# Patient Record
Sex: Female | Born: 1950 | Race: White | Hispanic: No | Smoking: Never smoker
Health system: Southern US, Community
[De-identification: ages and names within clinical notes are randomized; demographics above are authoritative.]

## PROBLEM LIST (undated history)

## (undated) DIAGNOSIS — I2510 Atherosclerotic heart disease of native coronary artery without angina pectoris: Secondary | ICD-10-CM

## (undated) DIAGNOSIS — E785 Hyperlipidemia, unspecified: Secondary | ICD-10-CM

## (undated) DIAGNOSIS — F99 Mental disorder, not otherwise specified: Secondary | ICD-10-CM

## (undated) DIAGNOSIS — M1990 Unspecified osteoarthritis, unspecified site: Secondary | ICD-10-CM

## (undated) DIAGNOSIS — I10 Essential (primary) hypertension: Secondary | ICD-10-CM

## (undated) DIAGNOSIS — E782 Mixed hyperlipidemia: Secondary | ICD-10-CM

## (undated) DIAGNOSIS — I209 Angina pectoris, unspecified: Secondary | ICD-10-CM

## (undated) DIAGNOSIS — D649 Anemia, unspecified: Secondary | ICD-10-CM

## (undated) DIAGNOSIS — I8390 Asymptomatic varicose veins of unspecified lower extremity: Secondary | ICD-10-CM

## (undated) DIAGNOSIS — K219 Gastro-esophageal reflux disease without esophagitis: Secondary | ICD-10-CM

## (undated) DIAGNOSIS — J45909 Unspecified asthma, uncomplicated: Secondary | ICD-10-CM

## (undated) DIAGNOSIS — I499 Cardiac arrhythmia, unspecified: Secondary | ICD-10-CM

## (undated) HISTORY — PX: CORONARY ANGIOPLASTY: SHX604

## (undated) HISTORY — DX: Mixed hyperlipidemia: E78.2

## (undated) HISTORY — PX: CARDIAC CATHETERIZATION: SHX172

## (undated) HISTORY — DX: Atherosclerotic heart disease of native coronary artery without angina pectoris: I25.10

## (undated) HISTORY — DX: Hyperlipidemia, unspecified: E78.5

## (undated) HISTORY — PX: VEIN SURGERY: SHX48

---

## 1995-04-17 HISTORY — PX: TOTAL ABDOMINAL HYSTERECTOMY W/ BILATERAL SALPINGOOPHORECTOMY: SHX83

## 1997-02-14 HISTORY — PX: BREAST BIOPSY: SHX20

## 1998-10-06 ENCOUNTER — Other Ambulatory Visit: Admission: RE | Admit: 1998-10-06 | Discharge: 1998-10-06 | Payer: Self-pay | Admitting: Obstetrics and Gynecology

## 2001-06-26 ENCOUNTER — Encounter: Payer: Self-pay | Admitting: Obstetrics and Gynecology

## 2001-06-26 ENCOUNTER — Encounter: Admission: RE | Admit: 2001-06-26 | Discharge: 2001-06-26 | Payer: Self-pay | Admitting: Obstetrics and Gynecology

## 2006-11-08 ENCOUNTER — Other Ambulatory Visit: Admission: RE | Admit: 2006-11-08 | Discharge: 2006-11-08 | Payer: Self-pay | Admitting: Obstetrics & Gynecology

## 2012-05-17 HISTORY — PX: CATARACT EXTRACTION: SUR2

## 2012-11-13 ENCOUNTER — Encounter (HOSPITAL_COMMUNITY): Payer: Self-pay | Admitting: Pharmacy Technician

## 2012-11-15 ENCOUNTER — Other Ambulatory Visit: Payer: Self-pay | Admitting: Orthopedic Surgery

## 2012-11-20 ENCOUNTER — Encounter (HOSPITAL_COMMUNITY)
Admission: RE | Admit: 2012-11-20 | Discharge: 2012-11-20 | Disposition: A | Discharge status: Home / Self Care | Payer: BC Managed Care – PPO | Source: Ambulatory Visit | Attending: Orthopedic Surgery | Admitting: Orthopedic Surgery

## 2012-11-20 ENCOUNTER — Encounter (HOSPITAL_COMMUNITY): Payer: Self-pay

## 2012-11-20 DIAGNOSIS — Z01818 Encounter for other preprocedural examination: Secondary | ICD-10-CM | POA: Insufficient documentation

## 2012-11-20 DIAGNOSIS — Z01812 Encounter for preprocedural laboratory examination: Secondary | ICD-10-CM | POA: Insufficient documentation

## 2012-11-20 DIAGNOSIS — Z01810 Encounter for preprocedural cardiovascular examination: Secondary | ICD-10-CM | POA: Insufficient documentation

## 2012-11-20 HISTORY — DX: Angina pectoris, unspecified: I20.9

## 2012-11-20 HISTORY — DX: Gastro-esophageal reflux disease without esophagitis: K21.9

## 2012-11-20 HISTORY — DX: Anemia, unspecified: D64.9

## 2012-11-20 HISTORY — DX: Essential (primary) hypertension: I10

## 2012-11-20 HISTORY — DX: Unspecified asthma, uncomplicated: J45.909

## 2012-11-20 HISTORY — DX: Asymptomatic varicose veins of unspecified lower extremity: I83.90

## 2012-11-20 HISTORY — DX: Unspecified osteoarthritis, unspecified site: M19.90

## 2012-11-20 HISTORY — DX: Cardiac arrhythmia, unspecified: I49.9

## 2012-11-20 HISTORY — DX: Mental disorder, not otherwise specified: F99

## 2012-11-20 LAB — URINALYSIS, ROUTINE W REFLEX MICROSCOPIC
Bilirubin Urine: NEGATIVE
Glucose, UA: NEGATIVE mg/dL
Hgb urine dipstick: NEGATIVE
Ketones, ur: NEGATIVE mg/dL
Leukocytes, UA: NEGATIVE
Nitrite: NEGATIVE
Protein, ur: NEGATIVE mg/dL
Specific Gravity, Urine: 1.022 (ref 1.005–1.030)
Urobilinogen, UA: 0.2 mg/dL (ref 0.0–1.0)
pH: 6 (ref 5.0–8.0)

## 2012-11-20 LAB — COMPREHENSIVE METABOLIC PANEL
ALT: 23 U/L (ref 0–35)
AST: 26 U/L (ref 0–37)
Albumin: 3.8 g/dL (ref 3.5–5.2)
Alkaline Phosphatase: 64 U/L (ref 39–117)
BUN: 23 mg/dL (ref 6–23)
CO2: 24 mEq/L (ref 19–32)
Calcium: 9.3 mg/dL (ref 8.4–10.5)
Chloride: 103 mEq/L (ref 96–112)
Creatinine, Ser: 0.98 mg/dL (ref 0.50–1.10)
GFR calc Af Amer: 69 mL/min — ABNORMAL LOW (ref >90)
GFR calc non Af Amer: 60 mL/min — ABNORMAL LOW (ref >90)
Glucose, Bld: 98 mg/dL (ref 70–99)
Potassium: 3.8 mEq/L (ref 3.5–5.1)
Sodium: 139 mEq/L (ref 135–145)
Total Bilirubin: 0.2 mg/dL — ABNORMAL LOW (ref 0.3–1.2)
Total Protein: 7.2 g/dL (ref 6.0–8.3)

## 2012-11-20 LAB — APTT: aPTT: 23 seconds — ABNORMAL LOW (ref 24–37)

## 2012-11-20 LAB — CBC WITH DIFFERENTIAL/PLATELET
Basophils Absolute: 0 10*3/uL (ref 0.0–0.1)
Basophils Relative: 1 % (ref 0–1)
Eosinophils Absolute: 0.1 10*3/uL (ref 0.0–0.7)
Eosinophils Relative: 1 % (ref 0–5)
HCT: 34.5 % — ABNORMAL LOW (ref 36.0–46.0)
Hemoglobin: 12.1 g/dL (ref 12.0–15.0)
Lymphocytes Relative: 35 % (ref 12–46)
Lymphs Abs: 1.9 10*3/uL (ref 0.7–4.0)
MCH: 31.6 pg (ref 26.0–34.0)
MCHC: 35.1 g/dL (ref 30.0–36.0)
MCV: 90.1 fL (ref 78.0–100.0)
Monocytes Absolute: 0.3 10*3/uL (ref 0.1–1.0)
Monocytes Relative: 6 % (ref 3–12)
Neutro Abs: 3.1 10*3/uL (ref 1.7–7.7)
Neutrophils Relative %: 57 % (ref 43–77)
Platelets: 240 10*3/uL (ref 150–400)
RBC: 3.83 MIL/uL — ABNORMAL LOW (ref 3.87–5.11)
RDW: 12.6 % (ref 11.5–15.5)
WBC: 5.4 10*3/uL (ref 4.0–10.5)

## 2012-11-20 LAB — TYPE AND SCREEN
ABO/RH(D): A POS
Antibody Screen: NEGATIVE

## 2012-11-20 LAB — SURGICAL PCR SCREEN
MRSA, PCR: NEGATIVE
Staphylococcus aureus: NEGATIVE

## 2012-11-20 LAB — ABO/RH: ABO/RH(D): A POS

## 2012-11-20 LAB — PROTIME-INR
INR: 0.89 (ref 0.00–1.49)
Prothrombin Time: 11.9 seconds (ref 11.6–15.2)

## 2012-11-20 NOTE — Pre-Procedure Instructions (Addendum)
Emily Ayers  11/20/2012   Your procedure is scheduled on: 7.14.14  Report to [facility] at 530 AM.  Call this number if you have problems the morning of surgery: [phone]   Remember:   Do not eat food or drink liquids after midnight.   Take these medicines the morning of surgery with A SIP OF WATER:prilosec           STOP   Aspirin,fish oil 7.9.14   Do not wear jewelry, make-up or nail polish.  Do not wear lotions, powders, or perfumes. You may wear deodorant.  Do not shave 48 hours prior to surgery. Men may shave face and neck.  Do not bring valuables to the hospital.  [facility] is not responsible                   for any belongings or valuables.  Contacts, dentures or bridgework may not be worn into surgery.  Leave suitcase in the car. After surgery it may be brought to your room.  For patients admitted to the hospital, checkout time is 11:00 AM the day of  discharge.   Patients discharged the day of surgery will not be allowed to drive  home.  Name and phone number of your driver:   Special Instructions: Incentive Spirometry - Practice and bring it with you on the day of surgery. Shower using CHG 2 nights before surgery and the night before surgery.  If you shower the day of surgery use CHG.  Use special wash - you have one bottle of CHG for all showers.  You should use approximately 1/3 of the bottle for each shower.   Please read over the following fact sheets that you were given: Pain Booklet, Coughing and Deep Breathing, Blood Transfusion Information, Total Joint Packet, MRSA Information and Surgical Site Infection Prevention

## 2012-11-20 NOTE — Progress Notes (Signed)
req'd notes, any tests from dr Nikki Dom cardiology Rosalita Levan

## 2012-11-22 LAB — URINE CULTURE: Colony Count: >=100000

## 2012-11-26 MED ORDER — SODIUM CHLORIDE 0.9 % IV SOLN: INTRAVENOUS | Status: DC

## 2012-11-26 MED ORDER — TRANEXAMIC ACID 100 MG/ML IV SOLN
1000.0000 mg | INTRAVENOUS | Status: AC
  Administered 2012-11-27: 1000 mg via INTRAVENOUS
  Filled 2012-11-26: qty 10

## 2012-11-26 MED ORDER — CEFAZOLIN SODIUM-DEXTROSE 2-3 GM-% IV SOLR
2.0000 g | INTRAVENOUS | Status: AC
  Administered 2012-11-27: 2 g via INTRAVENOUS
  Filled 2012-11-26: qty 50

## 2012-11-27 ENCOUNTER — Encounter (HOSPITAL_COMMUNITY): Payer: Self-pay | Admitting: Anesthesiology

## 2012-11-27 ENCOUNTER — Inpatient Hospital Stay (HOSPITAL_COMMUNITY): Payer: BC Managed Care – PPO | Admitting: Anesthesiology

## 2012-11-27 ENCOUNTER — Encounter (HOSPITAL_COMMUNITY): Payer: Self-pay | Admitting: *Deleted

## 2012-11-27 ENCOUNTER — Inpatient Hospital Stay (HOSPITAL_COMMUNITY)
Admission: RE | Admit: 2012-11-27 | Discharge: 2012-11-29 | DRG: 209 | Disposition: A | Discharge status: Home / Self Care | Payer: BC Managed Care – PPO | Source: Ambulatory Visit | Attending: Orthopedic Surgery | Admitting: Orthopedic Surgery

## 2012-11-27 ENCOUNTER — Encounter (HOSPITAL_COMMUNITY)
Admission: RE | Disposition: A | Discharge status: Home / Self Care | Payer: Self-pay | Source: Ambulatory Visit | Attending: Orthopedic Surgery

## 2012-11-27 DIAGNOSIS — K219 Gastro-esophageal reflux disease without esophagitis: Secondary | ICD-10-CM | POA: Diagnosis present

## 2012-11-27 DIAGNOSIS — IMO0002 Reserved for concepts with insufficient information to code with codable children: Secondary | ICD-10-CM

## 2012-11-27 DIAGNOSIS — J45909 Unspecified asthma, uncomplicated: Secondary | ICD-10-CM | POA: Diagnosis present

## 2012-11-27 DIAGNOSIS — Z7982 Long term (current) use of aspirin: Secondary | ICD-10-CM

## 2012-11-27 DIAGNOSIS — F319 Bipolar disorder, unspecified: Secondary | ICD-10-CM | POA: Diagnosis present

## 2012-11-27 DIAGNOSIS — M1710 Unilateral primary osteoarthritis, unspecified knee: Principal | ICD-10-CM | POA: Diagnosis present

## 2012-11-27 DIAGNOSIS — Z96652 Presence of left artificial knee joint: Secondary | ICD-10-CM

## 2012-11-27 HISTORY — PX: TOTAL KNEE ARTHROPLASTY: SHX125

## 2012-11-27 LAB — CBC
HCT: 29.3 % — ABNORMAL LOW (ref 36.0–46.0)
Hemoglobin: 10.2 g/dL — ABNORMAL LOW (ref 12.0–15.0)
MCH: 31.8 pg (ref 26.0–34.0)
MCHC: 34.8 g/dL (ref 30.0–36.0)
MCV: 91.3 fL (ref 78.0–100.0)
Platelets: 197 10*3/uL (ref 150–400)
RBC: 3.21 MIL/uL — ABNORMAL LOW (ref 3.87–5.11)
RDW: 12.6 % (ref 11.5–15.5)
WBC: 6.6 10*3/uL (ref 4.0–10.5)

## 2012-11-27 LAB — CREATININE, SERUM
Creatinine, Ser: 1.01 mg/dL (ref 0.50–1.10)
GFR calc Af Amer: 67 mL/min — ABNORMAL LOW (ref >90)
GFR calc non Af Amer: 58 mL/min — ABNORMAL LOW (ref >90)

## 2012-11-27 SURGERY — ARTHROPLASTY, KNEE, TOTAL
Anesthesia: General | Site: Knee | Laterality: Left | Wound class: Clean

## 2012-11-27 MED ORDER — DOCUSATE SODIUM 100 MG PO CAPS
100.0000 mg | ORAL_CAPSULE | Freq: Two times a day (BID) | ORAL | Status: DC
  Administered 2012-11-27 – 2012-11-29 (×5): 100 mg via ORAL
  Filled 2012-11-27 (×4): qty 1

## 2012-11-27 MED ORDER — HYDROMORPHONE HCL PF 1 MG/ML IJ SOLN
INTRAMUSCULAR | Status: AC
  Filled 2012-11-27: qty 1

## 2012-11-27 MED ORDER — SODIUM CHLORIDE 0.9 % IV SOLN
INTRAVENOUS | Status: DC
  Administered 2012-11-27 – 2012-11-28 (×2): via INTRAVENOUS

## 2012-11-27 MED ORDER — LACTATED RINGERS IV SOLN
INTRAVENOUS | Status: DC | PRN
  Administered 2012-11-27 (×3): via INTRAVENOUS

## 2012-11-27 MED ORDER — OXYCODONE HCL 5 MG PO TABS
5.0000 mg | ORAL_TABLET | ORAL | Status: DC | PRN
  Administered 2012-11-27 – 2012-11-29 (×4): 10 mg via ORAL
  Filled 2012-11-27 (×4): qty 2

## 2012-11-27 MED ORDER — ALBUTEROL SULFATE HFA 108 (90 BASE) MCG/ACT IN AERS
2.0000 | INHALATION_SPRAY | Freq: Four times a day (QID) | RESPIRATORY_TRACT | Status: DC | PRN
  Filled 2012-11-27: qty 6.7

## 2012-11-27 MED ORDER — HYDROMORPHONE HCL PF 1 MG/ML IJ SOLN
0.2500 mg | INTRAMUSCULAR | Status: DC | PRN
  Administered 2012-11-27 (×2): 0.5 mg via INTRAVENOUS

## 2012-11-27 MED ORDER — MENTHOL 3 MG MT LOZG: 1.0000 | LOZENGE | OROMUCOSAL | Status: DC | PRN

## 2012-11-27 MED ORDER — SODIUM CHLORIDE 0.9 % IJ SOLN
INTRAMUSCULAR | Status: DC | PRN
  Administered 2012-11-27: 08:00:00

## 2012-11-27 MED ORDER — CITALOPRAM HYDROBROMIDE 10 MG PO TABS
10.0000 mg | ORAL_TABLET | Freq: Every day | ORAL | Status: DC
  Administered 2012-11-27 – 2012-11-29 (×3): 10 mg via ORAL
  Filled 2012-11-27 (×3): qty 1

## 2012-11-27 MED ORDER — ACETAMINOPHEN 650 MG RE SUPP: 650.0000 mg | Freq: Four times a day (QID) | RECTAL | Status: DC | PRN

## 2012-11-27 MED ORDER — VALSARTAN-HYDROCHLOROTHIAZIDE 320-25 MG PO TABS: 1.0000 | ORAL_TABLET | Freq: Every day | ORAL | Status: DC

## 2012-11-27 MED ORDER — OXYCODONE HCL ER 10 MG PO T12A
10.0000 mg | EXTENDED_RELEASE_TABLET | Freq: Two times a day (BID) | ORAL | Status: DC
  Administered 2012-11-27 – 2012-11-29 (×5): 10 mg via ORAL
  Filled 2012-11-27 (×5): qty 1

## 2012-11-27 MED ORDER — PROMETHAZINE HCL 25 MG/ML IJ SOLN: 6.2500 mg | INTRAMUSCULAR | Status: DC | PRN

## 2012-11-27 MED ORDER — ONDANSETRON HCL 4 MG PO TABS: 4.0000 mg | ORAL_TABLET | Freq: Four times a day (QID) | ORAL | Status: DC | PRN

## 2012-11-27 MED ORDER — HYDROCHLOROTHIAZIDE 25 MG PO TABS
25.0000 mg | ORAL_TABLET | Freq: Every day | ORAL | Status: DC
  Administered 2012-11-27 – 2012-11-29 (×3): 25 mg via ORAL
  Filled 2012-11-27 (×3): qty 1

## 2012-11-27 MED ORDER — METHOCARBAMOL 500 MG PO TABS
500.0000 mg | ORAL_TABLET | Freq: Four times a day (QID) | ORAL | Status: DC | PRN
  Administered 2012-11-27 – 2012-11-28 (×2): 500 mg via ORAL
  Filled 2012-11-27 (×2): qty 1

## 2012-11-27 MED ORDER — PHENOL 1.4 % MT LIQD: 1.0000 | OROMUCOSAL | Status: DC | PRN

## 2012-11-27 MED ORDER — FLEET ENEMA 7-19 GM/118ML RE ENEM: 1.0000 | ENEMA | Freq: Once | RECTAL | Status: AC | PRN

## 2012-11-27 MED ORDER — FENTANYL CITRATE 0.05 MG/ML IJ SOLN
INTRAMUSCULAR | Status: DC | PRN
  Administered 2012-11-27 (×2): 25 ug via INTRAVENOUS
  Administered 2012-11-27: 100 ug via INTRAVENOUS
  Administered 2012-11-27 (×2): 25 ug via INTRAVENOUS

## 2012-11-27 MED ORDER — CHLORHEXIDINE GLUCONATE 4 % EX LIQD: 60.0000 mL | Freq: Once | CUTANEOUS | Status: DC

## 2012-11-27 MED ORDER — MONTELUKAST SODIUM 10 MG PO TABS
10.0000 mg | ORAL_TABLET | Freq: Every day | ORAL | Status: DC
  Administered 2012-11-27 – 2012-11-28 (×2): 10 mg via ORAL
  Filled 2012-11-27 (×3): qty 1

## 2012-11-27 MED ORDER — PROPOFOL 10 MG/ML IV BOLUS
INTRAVENOUS | Status: DC | PRN
  Administered 2012-11-27: 200 mg via INTRAVENOUS

## 2012-11-27 MED ORDER — METHOCARBAMOL 100 MG/ML IJ SOLN: 500.0000 mg | Freq: Four times a day (QID) | INTRAVENOUS | Status: DC | PRN

## 2012-11-27 MED ORDER — METOCLOPRAMIDE HCL 5 MG PO TABS
5.0000 mg | ORAL_TABLET | Freq: Three times a day (TID) | ORAL | Status: DC | PRN
  Filled 2012-11-27: qty 2

## 2012-11-27 MED ORDER — ALUM & MAG HYDROXIDE-SIMETH 200-200-20 MG/5ML PO SUSP
30.0000 mL | ORAL | Status: DC | PRN
  Administered 2012-11-27 – 2012-11-28 (×2): 30 mL via ORAL
  Filled 2012-11-27 (×2): qty 30

## 2012-11-27 MED ORDER — ENOXAPARIN SODIUM 30 MG/0.3ML ~~LOC~~ SOLN
30.0000 mg | Freq: Two times a day (BID) | SUBCUTANEOUS | Status: DC
  Administered 2012-11-28 – 2012-11-29 (×3): 30 mg via SUBCUTANEOUS
  Filled 2012-11-27 (×5): qty 0.3

## 2012-11-27 MED ORDER — DIPHENHYDRAMINE HCL 12.5 MG/5ML PO ELIX: 12.5000 mg | ORAL_SOLUTION | ORAL | Status: DC | PRN

## 2012-11-27 MED ORDER — SODIUM CHLORIDE 0.9 % IJ SOLN
INTRAMUSCULAR | Status: AC
  Filled 2012-11-27: qty 3

## 2012-11-27 MED ORDER — BUPIVACAINE LIPOSOME 1.3 % IJ SUSP
20.0000 mL | Freq: Once | INTRAMUSCULAR | Status: DC
  Filled 2012-11-27: qty 20

## 2012-11-27 MED ORDER — BUPIVACAINE HCL (PF) 0.5 % IJ SOLN
INTRAMUSCULAR | Status: AC
  Filled 2012-11-27: qty 30

## 2012-11-27 MED ORDER — GLYCOPYRROLATE 0.2 MG/ML IJ SOLN
INTRAMUSCULAR | Status: DC | PRN
  Administered 2012-11-27: 0.2 mg via INTRAVENOUS

## 2012-11-27 MED ORDER — ZOLPIDEM TARTRATE 5 MG PO TABS: 5.0000 mg | ORAL_TABLET | Freq: Every evening | ORAL | Status: DC | PRN

## 2012-11-27 MED ORDER — ONDANSETRON HCL 4 MG/2ML IJ SOLN: 4.0000 mg | Freq: Four times a day (QID) | INTRAMUSCULAR | Status: DC | PRN

## 2012-11-27 MED ORDER — PANTOPRAZOLE SODIUM 40 MG PO TBEC
40.0000 mg | DELAYED_RELEASE_TABLET | Freq: Every day | ORAL | Status: DC
  Administered 2012-11-28: 40 mg via ORAL
  Filled 2012-11-27: qty 1

## 2012-11-27 MED ORDER — ACETAMINOPHEN 325 MG PO TABS: 650.0000 mg | ORAL_TABLET | Freq: Four times a day (QID) | ORAL | Status: DC | PRN

## 2012-11-27 MED ORDER — BUPIVACAINE-EPINEPHRINE PF 0.5-1:200000 % IJ SOLN
INTRAMUSCULAR | Status: DC | PRN
  Administered 2012-11-27: 150 mg

## 2012-11-27 MED ORDER — ONDANSETRON HCL 4 MG/2ML IJ SOLN
INTRAMUSCULAR | Status: DC | PRN
  Administered 2012-11-27: 4 mg via INTRAVENOUS

## 2012-11-27 MED ORDER — IRBESARTAN 300 MG PO TABS
300.0000 mg | ORAL_TABLET | Freq: Every day | ORAL | Status: DC
  Administered 2012-11-27 – 2012-11-29 (×3): 300 mg via ORAL
  Filled 2012-11-27 (×3): qty 1

## 2012-11-27 MED ORDER — BISACODYL 5 MG PO TBEC: 5.0000 mg | DELAYED_RELEASE_TABLET | Freq: Every day | ORAL | Status: DC | PRN

## 2012-11-27 MED ORDER — BUPIVACAINE HCL 0.5 % IJ SOLN
INTRAMUSCULAR | Status: DC | PRN
  Administered 2012-11-27: 30 mL

## 2012-11-27 MED ORDER — METOCLOPRAMIDE HCL 5 MG/ML IJ SOLN: 5.0000 mg | Freq: Three times a day (TID) | INTRAMUSCULAR | Status: DC | PRN

## 2012-11-27 MED ORDER — CELECOXIB 200 MG PO CAPS
200.0000 mg | ORAL_CAPSULE | Freq: Two times a day (BID) | ORAL | Status: DC
  Administered 2012-11-27 – 2012-11-28 (×3): 200 mg via ORAL
  Filled 2012-11-27 (×6): qty 1

## 2012-11-27 MED ORDER — PHENYLEPHRINE HCL 10 MG/ML IJ SOLN
INTRAMUSCULAR | Status: DC | PRN
  Administered 2012-11-27: 120 ug via INTRAVENOUS
  Administered 2012-11-27 (×2): 80 ug via INTRAVENOUS
  Administered 2012-11-27: 40 ug via INTRAVENOUS

## 2012-11-27 MED ORDER — HYDROMORPHONE HCL PF 1 MG/ML IJ SOLN: 1.0000 mg | INTRAMUSCULAR | Status: DC | PRN

## 2012-11-27 MED ORDER — SODIUM CHLORIDE 0.9 % IR SOLN
Status: DC | PRN
  Administered 2012-11-27: 1000 mL
  Administered 2012-11-27: 3000 mL

## 2012-11-27 MED ORDER — SENNOSIDES-DOCUSATE SODIUM 8.6-50 MG PO TABS
1.0000 | ORAL_TABLET | Freq: Every evening | ORAL | Status: DC | PRN
  Filled 2012-11-27: qty 1

## 2012-11-27 MED ORDER — CEFAZOLIN SODIUM 1-5 GM-% IV SOLN
1.0000 g | Freq: Four times a day (QID) | INTRAVENOUS | Status: AC
  Administered 2012-11-27 (×2): 1 g via INTRAVENOUS
  Filled 2012-11-27 (×2): qty 50

## 2012-11-27 MED ORDER — MIDAZOLAM HCL 5 MG/5ML IJ SOLN
INTRAMUSCULAR | Status: DC | PRN
  Administered 2012-11-27: 2 mg via INTRAVENOUS

## 2012-11-27 MED ORDER — PNEUMOCOCCAL VAC POLYVALENT 25 MCG/0.5ML IJ INJ
0.5000 mL | INJECTION | INTRAMUSCULAR | Status: DC
  Filled 2012-11-27: qty 0.5

## 2012-11-27 SURGICAL SUPPLY — 57 items
BANDAGE ESMARK 6X9 LF (GAUZE/BANDAGES/DRESSINGS) ×1 IMPLANT
BLADE SAGITTAL 13X1.27X60 (BLADE) ×2 IMPLANT
BLADE SAW SGTL 83.5X18.5 (BLADE) ×2 IMPLANT
BNDG CMPR 9X6 STRL LF SNTH (GAUZE/BANDAGES/DRESSINGS) ×1
BNDG ESMARK 6X9 LF (GAUZE/BANDAGES/DRESSINGS) ×2
BOWL SMART MIX CTS (DISPOSABLE) ×2 IMPLANT
CEMENT BONE SIMPLEX SPEEDSET (Cement) ×4 IMPLANT
CLOTH BEACON ORANGE TIMEOUT ST (SAFETY) ×2 IMPLANT
COVER SURGICAL LIGHT HANDLE (MISCELLANEOUS) ×2 IMPLANT
CUFF TOURNIQUET SINGLE 34IN LL (TOURNIQUET CUFF) ×2 IMPLANT
DRAPE EXTREMITY T 121X128X90 (DRAPE) ×2 IMPLANT
DRAPE INCISE IOBAN 66X45 STRL (DRAPES) ×4 IMPLANT
DRAPE PROXIMA HALF (DRAPES) ×2 IMPLANT
DRAPE U-SHAPE 47X51 STRL (DRAPES) ×2 IMPLANT
DRSG ADAPTIC 3X8 NADH LF (GAUZE/BANDAGES/DRESSINGS) ×2 IMPLANT
DRSG PAD ABDOMINAL 8X10 ST (GAUZE/BANDAGES/DRESSINGS) ×2 IMPLANT
DURAPREP 26ML APPLICATOR (WOUND CARE) ×3 IMPLANT
ELECT REM PT RETURN 9FT ADLT (ELECTROSURGICAL) ×2
ELECTRODE REM PT RTRN 9FT ADLT (ELECTROSURGICAL) ×1 IMPLANT
EVACUATOR 1/8 PVC DRAIN (DRAIN) ×2 IMPLANT
GLOVE BIOGEL M 7.0 STRL (GLOVE) IMPLANT
GLOVE BIOGEL PI IND STRL 7.5 (GLOVE) IMPLANT
GLOVE BIOGEL PI IND STRL 8.5 (GLOVE) ×1 IMPLANT
GLOVE BIOGEL PI INDICATOR 7.5 (GLOVE)
GLOVE BIOGEL PI INDICATOR 8.5 (GLOVE) ×1
GLOVE BIOGEL PI ORTHO PRO SZ8 (GLOVE) ×1
GLOVE PI ORTHO PRO STRL SZ8 (GLOVE) ×1 IMPLANT
GLOVE SURG ORTHO 8.0 STRL STRW (GLOVE) ×4 IMPLANT
GOWN PREVENTION PLUS XLARGE (GOWN DISPOSABLE) ×5 IMPLANT
GOWN STRL NON-REIN LRG LVL3 (GOWN DISPOSABLE) ×2 IMPLANT
HANDPIECE INTERPULSE COAX TIP (DISPOSABLE) ×2
HOOD PEEL AWAY FACE SHEILD DIS (HOOD) ×7 IMPLANT
KIT BASIN OR (CUSTOM PROCEDURE TRAY) ×2 IMPLANT
KIT ROOM TURNOVER OR (KITS) ×2 IMPLANT
MANIFOLD NEPTUNE II (INSTRUMENTS) ×2 IMPLANT
NDL HYPO 21X1.5 SAFETY (NEEDLE) ×1 IMPLANT
NEEDLE 22X1 1/2 (OR ONLY) (NEEDLE) ×1 IMPLANT
NEEDLE HYPO 21X1.5 SAFETY (NEEDLE) ×2 IMPLANT
NS IRRIG 1000ML POUR BTL (IV SOLUTION) ×2 IMPLANT
PACK TOTAL JOINT (CUSTOM PROCEDURE TRAY) ×2 IMPLANT
PAD ARMBOARD 7.5X6 YLW CONV (MISCELLANEOUS) ×4 IMPLANT
PADDING CAST COTTON 6X4 STRL (CAST SUPPLIES) ×2 IMPLANT
SET HNDPC FAN SPRY TIP SCT (DISPOSABLE) ×1 IMPLANT
SPONGE GAUZE 4X4 12PLY (GAUZE/BANDAGES/DRESSINGS) ×2 IMPLANT
STAPLER VISISTAT 35W (STAPLE) ×2 IMPLANT
SUCTION FRAZIER TIP 10 FR DISP (SUCTIONS) ×2 IMPLANT
SUT BONE WAX W31G (SUTURE) ×2 IMPLANT
SUT VIC AB 0 CTB1 27 (SUTURE) ×4 IMPLANT
SUT VIC AB 1 CT1 27 (SUTURE) ×4
SUT VIC AB 1 CT1 27XBRD ANBCTR (SUTURE) ×2 IMPLANT
SUT VIC AB 2-0 CT1 27 (SUTURE) ×4
SUT VIC AB 2-0 CT1 TAPERPNT 27 (SUTURE) ×2 IMPLANT
SYR CONTROL 10ML LL (SYRINGE) ×1 IMPLANT
TOWEL OR 17X24 6PK STRL BLUE (TOWEL DISPOSABLE) ×2 IMPLANT
TOWEL OR 17X26 10 PK STRL BLUE (TOWEL DISPOSABLE) ×2 IMPLANT
TRAY FOLEY CATH 16FRSI W/METER (SET/KITS/TRAYS/PACK) ×2 IMPLANT
WATER STERILE IRR 1000ML POUR (IV SOLUTION) ×3 IMPLANT

## 2012-11-27 NOTE — Anesthesia Preprocedure Evaluation (Addendum)
Anesthesia Evaluation  Patient identified by MRN, date of birth, ID band Patient awake    Reviewed: Allergy & Precautions, H&P , NPO status , Patient's Chart, lab work & pertinent test results, reviewed documented beta blocker date and time   Airway Mallampati: II TM Distance: >3 FB Neck ROM: Full    Dental  (+) Teeth Intact and Dental Advisory Given   Pulmonary asthma ,    Pulmonary exam normal       Cardiovascular hypertension, Pt. on medications     Neuro/Psych PSYCHIATRIC DISORDERS Bipolar Disorder negative neurological ROS     GI/Hepatic Neg liver ROS, GERD-  Medicated,  Endo/Other  negative endocrine ROS  Renal/GU negative Renal ROS     Musculoskeletal   Abdominal   Peds  Hematology negative hematology ROS (+)   Anesthesia Other Findings   Reproductive/Obstetrics                         Anesthesia Physical Anesthesia Plan  ASA: II  Anesthesia Plan: General   Post-op Pain Management:    Induction: Intravenous  Airway Management Planned: LMA  Additional Equipment:   Intra-op Plan:   Post-operative Plan: Extubation in OR  Informed Consent: I have reviewed the patients History and Physical, chart, labs and discussed the procedure including the risks, benefits and alternatives for the proposed anesthesia with the patient or authorized representative who has indicated his/her understanding and acceptance.   Dental advisory given  Plan Discussed with: CRNA, Anesthesiologist and Surgeon  Anesthesia Plan Comments:        Anesthesia Quick Evaluation

## 2012-11-27 NOTE — Transfer of Care (Signed)
Immediate Anesthesia Transfer of Care Note  Patient: Emily Ayers  Procedure(s) Performed: Procedure(s): TOTAL KNEE ARTHROPLASTY (Left)  Patient Location: PACU  Anesthesia Type:GA combined with regional for post-op pain  Level of Consciousness: awake, alert  and oriented  Airway & Oxygen Therapy: Patient Spontanous Breathing and Patient connected to nasal cannula oxygen  Post-op Assessment: Report given to PACU RN and Post -op Vital signs reviewed and stable  Post vital signs: Reviewed and stable  Complications: No apparent anesthesia complications

## 2012-11-27 NOTE — Preoperative (Signed)
Beta Blockers   Reason not to administer Beta Blockers:Not Applicable. No home beta blockers 
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
## 2012-11-27 NOTE — Progress Notes (Signed)
UR COMPLETED  

## 2012-11-27 NOTE — Plan of Care (Signed)
Problem: Consults Goal: Diagnosis- Total Joint Replacement Primary Total Knee Left     

## 2012-11-27 NOTE — Anesthesia Procedure Notes (Addendum)
Anesthesia Regional Block:  Femoral nerve block  Pre-Anesthetic Checklist: ,, timeout performed, Correct Patient, Correct Site, Correct Laterality, Correct Procedure,, site marked, risks and benefits discussed, Surgical consent,  Pre-op evaluation,  At surgeon's request and post-op pain management  Laterality: Left  Prep: chloraprep       Needles:  Injection technique: Single-shot  Needle Type: Echogenic Stimulator Needle     Needle Length: 5cm 5 cm Needle Gauge: 22 and 22 G    Additional Needles:  Procedures: ultrasound guided (picture in chart) and nerve stimulator Femoral nerve block  Nerve Stimulator or Paresthesia:  Response: quadraceps contraction, 0.45 mA,   Additional Responses:   Narrative:  Start time: 11/27/2012 7:06 AM End time: 11/27/2012 7:16 AM Injection made incrementally with aspirations every 5 mL.  Performed by: Personally  Anesthesiologist: Halford Decamp, MD  Additional Notes: Functioning IV was confirmed and monitors were applied.  A 50mm 22ga Arrow echogenic stimulator needle was used. Sterile prep and drape,hand hygiene and sterile gloves were used. Ultrasound guidance: relevant anatomy identified, needle position confirmed, local anesthetic spread visualized around nerve(s)., vascular puncture avoided.  Image printed for medical record. Negative aspiration and negative test dose prior to incremental administration of local anesthetic. The patient tolerated the procedure well.    Femoral nerve block

## 2012-11-27 NOTE — Evaluation (Signed)
Physical Therapy Evaluation Patient Details Name: Emily Ayers MRN: [id number] DOB: 1951/01/15 Today's Date: 11/27/2012 Time: 1545-1610 PT Time Calculation (min): 25 min  PT Assessment / Plan / Recommendation History of Present Illness  Patient is a 62 yo female s/p Lt. TKA.  Clinical Impression  Patient presents with problems listed below.  Will benefit from acute PT to address these issues prior to return home.    PT Assessment  Patient needs continued PT services    Follow Up Recommendations  Home health PT;Supervision/Assistance - 24 hour    Does the patient have the potential to tolerate intense rehabilitation      Barriers to Discharge        Equipment Recommendations  Rolling walker with 5" wheels;3in1 (PT)    Recommendations for Other Services     Frequency 7X/week    Precautions / Restrictions Precautions Precautions: Knee Precaution Booklet Issued: Yes (comment) Precaution Comments: Reviewed precautions with patient. Restrictions Weight Bearing Restrictions: Yes LLE Weight Bearing: Weight bearing as tolerated   Pertinent Vitals/Pain       Mobility  Bed Mobility Bed Mobility: Supine to Sit;Sitting - Scoot to Edge of Bed Supine to Sit: 4: Min assist;HOB flat Sitting - Scoot to [place] of Bed: 4: Min guard Details for Bed Mobility Assistance: Verbal cues for technique.  Assist to move LLE off of bed. Transfers Transfers: Sit to Stand;Stand to [employer] Transfers Sit to Stand: 4: Min assist;With upper extremity assist;From bed Stand to Sit: 4: Min assist;With upper extremity assist;With armrests;To chair/3-in-1 Stand Pivot Transfers: 4: Min assist Details for Transfer Assistance: Verbal cues for hand placement and technique.  Assist to rise to standing and for balance.  Patient able to take several steps to pivot to chair. Ambulation/Gait Ambulation/Gait Assistance: Not tested (comment)    Exercises Total Joint Exercises Ankle Circles/Pumps:  AROM;Both;10 reps;Seated   PT Diagnosis: Difficulty walking;Acute pain  PT Problem List: Decreased strength;Decreased range of motion;Decreased activity tolerance;Decreased balance;Decreased mobility;Decreased knowledge of use of DME;Decreased knowledge of precautions;Pain PT Treatment Interventions: DME instruction;Gait training;Stair training;Functional mobility training;Therapeutic exercise;Patient/family education     PT Goals(Current goals can be found in the care plan section) Acute Rehab PT Goals Patient Stated Goal: To walk PT Goal Formulation: With patient Time For Goal Achievement: 12/04/12 Potential to Achieve Goals: Good  Visit Information  Last PT Received On: 11/27/12 Assistance Needed: +1 History of Present Illness: Patient is a 62 yo female s/p Lt. TKA.       Prior Functioning  Home Living Family/patient expects to be discharged to:: Private residence Living Arrangements: Alone Available Help at Discharge: Family;Available 24 hours/day (Cousin coming to stay with patient) Type of Home: House Home Access: Stairs to enter [employer] of Steps: 2 Entrance Stairs-Rails: Right;Left Home Layout: One level Home Equipment: None Prior Function Level of Independence: Independent Communication Communication: No difficulties    Cognition  Cognition Arousal/Alertness: Awake/alert Behavior During Therapy: WFL for tasks assessed/performed Overall Cognitive Status: Within Functional Limits for tasks assessed    Extremity/Trunk Assessment Upper Extremity Assessment Upper Extremity Assessment: Overall WFL for tasks assessed Lower Extremity Assessment Lower Extremity Assessment: LLE deficits/detail LLE Deficits / Details: Able to assist with moving LLE off of bed. LLE: Unable to fully assess due to pain Cervical / Trunk Assessment Cervical / Trunk Assessment: Normal   Balance    End of Session PT - End of Session Equipment Utilized During Treatment: Gait  belt Activity Tolerance: Patient tolerated treatment well Patient left: in chair;with  call bell/phone within reach;with family/visitor present Nurse Communication: Mobility status CPM Left Knee CPM Left Knee: Off (off at 15:50)  GP     Vena Austria 11/27/2012, 4:41 PM Durenda Hurt. Renaldo Fiddler, [facility] Acute Rehab Services Pager [phone]

## 2012-11-27 NOTE — Anesthesia Postprocedure Evaluation (Signed)
Anesthesia Post Note  Patient: Emily Ayers  Procedure(s) Performed: Procedure(s) (LRB): TOTAL KNEE ARTHROPLASTY (Left)  Anesthesia type: general  Patient location: PACU  Post pain: Pain level controlled  Post assessment: Patient's Cardiovascular Status Stable  Post vital signs: Reviewed and stable  Level of consciousness: sedated  Complications: No apparent anesthesia complications

## 2012-11-27 NOTE — Progress Notes (Signed)
Orthopedic Tech Progress Note Patient Details:  Emily Ayers [facility] [birth year] [id number] CPM applied to Left LE with appropriate settings. OHF applied to bed.  CPM Left Knee CPM Left Knee: On Left Knee Flexion (Degrees): 90 Left Knee Extension (Degrees): 0   Asia R Thompson 11/27/2012, 11:08 AM

## 2012-11-28 ENCOUNTER — Encounter (HOSPITAL_COMMUNITY): Payer: Self-pay | Admitting: Orthopedic Surgery

## 2012-11-28 LAB — BASIC METABOLIC PANEL
BUN: 14 mg/dL (ref 6–23)
CO2: 29 mEq/L (ref 19–32)
Calcium: 8 mg/dL — ABNORMAL LOW (ref 8.4–10.5)
Chloride: 95 mEq/L — ABNORMAL LOW (ref 96–112)
Creatinine, Ser: 0.89 mg/dL (ref 0.50–1.10)
GFR calc Af Amer: 78 mL/min — ABNORMAL LOW (ref >90)
GFR calc non Af Amer: 67 mL/min — ABNORMAL LOW (ref >90)
Glucose, Bld: 119 mg/dL — ABNORMAL HIGH (ref 70–99)
Potassium: 3.3 mEq/L — ABNORMAL LOW (ref 3.5–5.1)
Sodium: 130 mEq/L — ABNORMAL LOW (ref 135–145)

## 2012-11-28 LAB — CBC
HCT: 26.5 % — ABNORMAL LOW (ref 36.0–46.0)
Hemoglobin: 9.2 g/dL — ABNORMAL LOW (ref 12.0–15.0)
MCH: 31.2 pg (ref 26.0–34.0)
MCHC: 34.7 g/dL (ref 30.0–36.0)
MCV: 89.8 fL (ref 78.0–100.0)
Platelets: 181 10*3/uL (ref 150–400)
RBC: 2.95 MIL/uL — ABNORMAL LOW (ref 3.87–5.11)
RDW: 12.5 % (ref 11.5–15.5)
WBC: 5.5 10*3/uL (ref 4.0–10.5)

## 2012-11-28 MED ORDER — METHOCARBAMOL 500 MG PO TABS: 500.0000 mg | ORAL_TABLET | Freq: Four times a day (QID) | ORAL | Status: DC | PRN

## 2012-11-28 MED ORDER — OXYCODONE HCL 5 MG PO TABS: 5.0000 mg | ORAL_TABLET | ORAL | Status: DC | PRN

## 2012-11-28 MED ORDER — ENOXAPARIN SODIUM 40 MG/0.4ML ~~LOC~~ SOLN: 40.0000 mg | SUBCUTANEOUS | Status: DC

## 2012-11-28 MED ORDER — PNEUMOCOCCAL VAC POLYVALENT 25 MCG/0.5ML IJ INJ
0.5000 mL | INJECTION | INTRAMUSCULAR | Status: AC
  Administered 2012-11-29: 0.5 mL via INTRAMUSCULAR
  Filled 2012-11-28: qty 0.5

## 2012-11-28 MED ORDER — OXYCODONE HCL ER 10 MG PO T12A: 10.0000 mg | EXTENDED_RELEASE_TABLET | Freq: Two times a day (BID) | ORAL | Status: DC

## 2012-11-28 NOTE — Care Management Note (Signed)
    Page 1 of 2   11/28/2012     4:10:27 PM   CARE MANAGEMENT NOTE 11/28/2012  Patient:  LAQUASIA, PINCUS   Account Number:  [id number]  Date Initiated:  11/28/2012  Documentation initiated by:  Jacquelynn Cree  Subjective/Objective Assessment:   admitted postop left total knee arthroplasty     Action/Plan:   plan return home with HHPT   Anticipated DC Date:  11/29/2012   Anticipated DC Plan:  HOME W HOME HEALTH SERVICES      DC Planning Services  CM consult      Choice offered to / List presented to:     DME arranged  3-N-1  WALKER - ROLLING  CPM      DME agency  [employer]     HH arranged  HH-2 PT      [facility] agency  [facility]   Status of service:  Completed, signed off Medicare Important Message given?   (If response is "NO", the following Medicare IM given date fields will be blank) Date Medicare IM given:   Date Additional Medicare IM given:    Discharge Disposition:  HOME W HOME HEALTH SERVICES  Per UR Regulation:    If discussed at Long Length of Stay Meetings, dates discussed:    Comments:  11/28/12 Patient set up with [facility] for HHPT by MD office. Spoke with patient, d/c plan is unchanged. Jacquelynn Cree RN, BSN, CCM

## 2012-11-28 NOTE — Progress Notes (Signed)
Physical Therapy Treatment Patient Details Name: Emily Ayers MRN: [id number] DOB: Dec 17, 1950 Today's Date: 11/28/2012 Time: 4098-1191 PT Time Calculation (min): 32 min  PT Assessment / Plan / Recommendation  PT Comments   Pt able to ambulate this session and improve overall mobility.  Plan to practice steps next session.   Follow Up Recommendations  Home health PT;Supervision/Assistance - 24 hour     Barriers to Discharge        Equipment Recommendations  Rolling walker with 5" wheels;3in1 (PT)    Recommendations for Other Services    Frequency 7X/week   Progress towards PT Goals Progress towards PT goals: Progressing toward goals  Plan Current plan remains appropriate    Precautions / Restrictions Precautions Precautions: Knee Precaution Booklet Issued: No Precaution Comments: Reviewed precautions with patient. Restrictions Weight Bearing Restrictions: Yes LLE Weight Bearing: Weight bearing as tolerated   Pertinent Vitals/Pain 3/10 in left knee    Mobility  Bed Mobility Bed Mobility: Supine to Sit;Sitting - Scoot to Edge of Bed Supine to Sit: 4: Min guard;With rails Sitting - Scoot to Edge of Bed: 4: Min guard Details for Bed Mobility Assistance: minguard for safety with cues for technique Transfers Transfers: Sit to Stand;Stand to Sit;Stand Pivot Transfers Sit to Stand: 4: Min guard;From bed;With armrests;From chair/3-in-1 Stand to Sit: 4: Min guard;To chair/3-in-1 Details for Transfer Assistance: Minguard for safety with cues for hand placement and LE placement.  Ambulation/Gait Ambulation/Gait Assistance: 4: Min guard Ambulation Distance (Feet): 100 Feet Assistive device: Rolling walker Ambulation/Gait Assistance Details: Minguard for safety with cues for proper step sequence and body position within RW Gait Pattern: Step-to pattern;Decreased stride length;Antalgic Stairs: No    Exercises     PT Diagnosis:    PT Problem List:   PT Treatment  Interventions:     PT Goals (current goals can now be found in the care plan section) Acute Rehab PT Goals Patient Stated Goal: To get stronger PT Goal Formulation: With patient Time For Goal Achievement: 12/04/12 Potential to Achieve Goals: Good  Visit Information  Last PT Received On: 11/28/12 Assistance Needed: +1 PT/OT Co-Evaluation/Treatment: Yes History of Present Illness: Patient is a 62 yo female s/p Lt. TKA.    Subjective Data  Subjective: "Am I doing ok?" Patient Stated Goal: To get stronger   Cognition  Cognition Arousal/Alertness: Awake/alert Behavior During Therapy: WFL for tasks assessed/performed Overall Cognitive Status: Within Functional Limits for tasks assessed    Balance     End of Session PT - End of Session Equipment Utilized During Treatment: Gait belt Activity Tolerance: Patient tolerated treatment well Patient left: in chair;with call bell/phone within reach;with family/visitor present Nurse Communication: Mobility status CPM Left Knee CPM Left Knee: Off Left Knee Flexion (Degrees): 90 Left Knee Extension (Degrees): 0   GP     Tenille Morrill 11/28/2012, 11:21 AM  Jake Shark, PT DPT [phone]

## 2012-11-28 NOTE — Progress Notes (Signed)
SPORTS MEDICINE AND JOINT REPLACEMENT  [name], MD   [name], PA-C [address] [place], [name], [place]  [address]                             [phone]   PROGRESS NOTE  Subjective:  negative for Chest Pain  negative for Shortness of Breath  negative for Nausea/Vomiting   negative for Calf Pain  negative for Bowel Movement   Tolerating Diet: yes         Patient reports pain as 5 on 0-10 scale.    Objective: Vital signs in last 24 hours:   Patient Vitals for the past 24 hrs:  BP Temp Pulse Resp SpO2  11/28/12 0523 130/60 mmHg 99.1 F (37.3 C) 71 14 97 %  11/28/12 0400 - - - 16 -  11/28/12 0143 128/77 mmHg 98.7 F (37.1 C) 87 14 95 %  11/28/12 0000 - - - 16 -  11/27/12 2050 151/83 mmHg 98.5 F (36.9 C) 66 16 96 %  11/27/12 2000 - - - 16 -  11/27/12 1751 121/76 mmHg 98.1 F (36.7 C) 62 18 97 %  11/27/12 1600 - - - 16 96 %  11/27/12 1430 127/75 mmHg 98 F (36.7 C) 60 18 99 %    @flow {1959:LAST@   Intake/Output from previous day:   07/14 0701 - 07/15 0700 In: 3613.8 [P.O.:240; I.V.:3173.8] Out: 1560 [Urine:1350; Drains:210]   Intake/Output this shift:   07/15 0701 - 07/15 1900 In: 240 [P.O.:240] Out: -    Intake/Output     07/14 0701 - 07/15 0700 07/15 0701 - 07/16 0700   P.O. 240 240   I.V. 3173.8    Other 150    IV Piggyback 50    Total Intake 3613.8 240   Urine 1350    Drains 210    Total Output 1560     Net +2053.8 +240           LABORATORY DATA:  Recent Labs  11/27/12 1736 11/28/12 0535  WBC 6.6 5.5  HGB 10.2* 9.2*  HCT 29.3* 26.5*  PLT 197 181    Recent Labs  11/27/12 1736 11/28/12 0535  NA  --  130*  K  --  3.3*  CL  --  95*  CO2  --  29  BUN  --  14  CREATININE 1.01 0.89  GLUCOSE  --  119*  CALCIUM  --  8.0*   Lab Results  Component Value Date   INR 0.89 11/20/2012    Examination:  General appearance: alert, cooperative and no distress Extremities: Homans sign is negative, no sign of DVT  Wound Exam:  clean, dry, intact   Drainage:  Scant/small amount Serosanguinous exudate  Motor Exam: EHL and FHL Intact  Sensory Exam: Deep Peroneal normal   Assessment:    1 Day Post-Op  Procedure(s) (LRB): TOTAL KNEE ARTHROPLASTY (Left)  ADDITIONAL DIAGNOSIS:  Active Problems:   * No active hospital problems. *  Acute Blood Loss Anemia   Plan: Physical Therapy as ordered Weight Bearing as Tolerated (WBAT)  DVT Prophylaxis:  Lovenox  DISCHARGE PLAN: Home  DISCHARGE NEEDS: HHPT, CPM, Walker and 3-in-1 comode seat         Emily Ayers 11/28/2012, 12:34 PM

## 2012-11-28 NOTE — Op Note (Signed)
TOTAL KNEE REPLACEMENT OPERATIVE NOTE:  11/27/2012  2:39 PM  PATIENT:  Emily Ayers  62 y.o. female  PRE-OPERATIVE DIAGNOSIS:  osteoarthritis left knee  POST-OPERATIVE DIAGNOSIS:  osteoarthritis left knee  PROCEDURE:  Procedure(s): TOTAL KNEE ARTHROPLASTY  SURGEON:  Surgeon(s): Dannielle Huh, MD  PHYSICIAN ASSISTANT: [name], [facility]  ANESTHESIA:   general  DRAINS: Hemovac and On-Q Marcaine Pain Pump  SPECIMEN: None  COUNTS:  Correct  TOURNIQUET:  * Missing tourniquet times found for documented tourniquets in log:  99760 *  DICTATION:  Indication for procedure:    The patient is a 63 y.o. female who has failed conservative treatment for osteoarthritis left knee.  Informed consent was obtained prior to anesthesia. The risks versus benefits of the operation were explain and in a way the patient can, and did, understand.   Description of procedure:     The patient was taken to the operating room and placed under anesthesia.  The patient was positioned in the usual fashion taking care that all body parts were adequately padded and/or protected.  I foley catheter was placed.  A tourniquet was applied and the leg prepped and draped in the usual sterile fashion.  The extremity was exsanguinated with the esmarch and tourniquet inflated to 350 mmHg.  Pre-operative range of motion was normal.  The knee was in 5 degree of mild valgus.  A midline incision approximately 6-7 inches long was made with a #10 blade.  A new blade was used to make a parapatellar arthrotomy going 2-3 cm into the quadriceps tendon, over the patella, and alongside the medial aspect of the patellar tendon.  A synovectomy was then performed with the #10 blade and forceps. I then elevated the deep MCL off the medial tibial metaphysis subperiosteally around to the semimembranosus attachment.    I everted the patella and used calipers to measure patellar thickness.  I used the reamer to ream down to appropriate  thickness to recreate the native thickness.  I then removed excess bone with the rongeur and sagittal saw.  I used the appropriately sized template and drilled the three lug holes.  I then put the trial in place and measured the thickness with the calipers to ensure recreation of the native thickness.  The trial was then removed and the patella subluxed and the knee brought into flexion.  A homan retractor was place to retract and protect the patella and lateral structures.  A Z-retractor was place medially to protect the medial structures.  The extra-medullary alignment system was used to make cut the tibial articular surface perpendicular to the anamotic axis of the tibia and in 3 degrees of posterior slope.  The cut surface and alignment jig was removed.  I then used the intramedullary alignment guide to make a 4 valgus cut on the distal femur.  I then marked out the epicondylar axis on the distal femur.  The posterior condylar axis measured 5 degrees.  I then used the anterior referencing sizer and measured the femur to be a size E.  The 4-In-1 cutting block was screwed into place in external rotation matching the posterior condylar angle, making our cuts perpendicular to the epicondylar axis.  Anterior, posterior and chamfer cuts were made with the sagittal saw.  The cutting block and cut pieces were removed.  A lamina spreader was placed in 90 degrees of flexion.  The ACL, PCL, menisci, and posterior condylar osteophytes were removed.  A 10 mm spacer blocked was found to offer  good flexion and extension gap balance after minimal in degree releasing.   The scoop retractor was then placed and the femoral finishing block was pinned in place.  The small sagittal saw was used as well as the lug drill to finish the femur.  The block and cut surfaces were removed and the medullary canal hole filled with autograft bone from the cut pieces.  The tibia was delivered forward in deep flexion and external rotation.   A size 4 tray was selected and pinned into place centered on the medial 1/3 of the tibial tubercle.  The reamer and keel was used to prepare the tibia through the tray.    I then trialed with the size E femur, size 4 tibia, a 10 mm insert and the 35 patella.  I had excellent flexion/extension gap balance, excellent patella tracking.  Flexion was full and beyond 120 degrees; extension was zero.  These components were chosen and the staff opened them to me on the back table while the knee was lavaged copiously and the cement mixed.  I cemented in the components and removed all excess cement.  The polyethylene tibial component was snapped into place and the knee placed in extension while cement was hardening.  The capsule was infilltrated with 20cc of .25% Marcaine with epinephrine.  A hemovac was place in the joint exiting superolaterally.  A pain pump was place superomedially superficial to the arthrotomy.  Once the cement was hard, the tourniquet was let down.  Hemostasis was obtained.  The arthrotomy was closed with figure-8 #1 vicryl sutures.  The deep soft tissues were closed with #0 vicryls and the subcuticular layer closed with a running #2-0 vicryl.  The skin was reapproximated and closed with skin staples.  The wound was dressed with xeroform, 4 x4's, 2 ABD sponges, a single layer of webril and a TED stocking.   The patient was then awakened, extubated, and taken to the recovery room in stable condition.  BLOOD LOSS:  300cc DRAINS: 1 hemovac, 1 pain catheter COMPLICATIONS:  None.  PLAN OF CARE: Admit to inpatient   PATIENT DISPOSITION:  PACU - hemodynamically stable.   Delay start of Pharmacological VTE agent (>24hrs) due to surgical blood loss or risk of bleeding:  not applicable  Please fax a copy of this op note to my office at [phone] (please only include page 1 and 2 of the Case Information op note)

## 2012-11-28 NOTE — Evaluation (Signed)
Occupational Therapy Evaluation Patient Details Name: Emily Ayers MRN: [id number] DOB: 1950/06/02 Today's Date: 11/28/2012 Time: 4098-1191 OT Time Calculation (min): 27 min  OT Assessment / Plan / Recommendation History of present illness Patient is a 62 yo female s/p Lt. TKA.   Clinical Impression   Pt presents with below problem list. Will benefit from acute OT to address issues below prior to return home.     OT Assessment  Patient needs continued OT Services    Follow Up Recommendations  Supervision/Assistance - 24 hour;No OT follow up    Barriers to Discharge      Equipment Recommendations  3 in 1 bedside comode    Recommendations for Other Services    Frequency  Min 2X/week    Precautions / Restrictions Precautions Precautions: Knee Precaution Booklet Issued: No Precaution Comments: Reviewed precautions with patient. Restrictions Weight Bearing Restrictions: Yes LLE Weight Bearing: Weight bearing as tolerated   Pertinent Vitals/Pain Pain 3/10 in knee. Repositioned.     ADL  Eating/Feeding: Independent Where Assessed - Eating/Feeding: Chair Grooming: Set up;Wash/dry face Where Assessed - Grooming: Supported sitting Upper Body Bathing: Set up Where Assessed - Upper Body Bathing: Supported sitting Lower Body Bathing: Minimal assistance Where Assessed - Lower Body Bathing: Supported sit to stand Upper Body Dressing: Set up Where Assessed - Upper Body Dressing: Supported sitting Lower Body Dressing: Minimal assistance Where Assessed - Lower Body Dressing: Supported sit to Pharmacist, hospital: Hydrographic surveyor Method: Sit to Barista: Raised toilet seat with arms (or 3-in-1 over toilet) Toileting - Clothing Manipulation and Hygiene: Min guard (clothing) Where Assessed - Glass blower/designer Manipulation and Hygiene: Standing Tub/Shower Transfer Method: Not assessed Equipment Used: Gait belt;Rolling  walker Transfers/Ambulation Related to ADLs: Minguard for ambulation and transfers. ADL Comments: Pt overall Min A level for LB ADLs.     OT Diagnosis: Acute pain  OT Problem List: Decreased strength;Decreased range of motion;Decreased activity tolerance;Impaired balance (sitting and/or standing);Decreased knowledge of use of DME or AE;Decreased knowledge of precautions;Pain OT Treatment Interventions: Self-care/ADL training;DME and/or AE instruction;Therapeutic activities;Patient/family education;Balance training   OT Goals(Current goals can be found in the care plan section) Acute Rehab OT Goals Patient Stated Goal: To get stronger OT Goal Formulation: With patient Time For Goal Achievement: 12/05/12 Potential to Achieve Goals: Good ADL Goals Pt Will Perform Grooming: with modified independence;standing Pt Will Perform Lower Body Bathing: with modified independence;sit to/from stand Pt Will Perform Lower Body Dressing: with modified independence;sit to/from stand Pt Will Transfer to Toilet: with modified independence;bedside commode Pt Will Perform Toileting - Clothing Manipulation and hygiene: with modified independence;sit to/from stand Pt Will Perform Tub/Shower Transfer: Shower transfer;with supervision;ambulating;3 in 1  Visit Information  Last OT Received On: 11/28/12 Assistance Needed: +1 PT/OT Co-Evaluation/Treatment: Yes History of Present Illness: Patient is a 62 yo female s/p Lt. TKA.       Prior Functioning     Home Living Family/patient expects to be discharged to:: Private residence Living Arrangements: Alone Available Help at Discharge: Family;Available 24 hours/day (cousin coming to stay with patient) Type of Home: House Home Access: Stairs to enter [employer] of Steps: 2 Entrance Stairs-Rails: Right;Left Home Layout: One level Home Equipment: None Prior Function Level of Independence: Independent Communication Communication: No difficulties          Vision/Perception     Cognition  Cognition Arousal/Alertness: Awake/alert Behavior During Therapy: WFL for tasks assessed/performed Overall Cognitive Status: Within Functional Limits for tasks assessed  Extremity/Trunk Assessment Upper Extremity Assessment Upper Extremity Assessment: Overall WFL for tasks assessed Lower Extremity Assessment Lower Extremity Assessment: Defer to PT evaluation     Mobility Bed Mobility Bed Mobility: Supine to Sit;Sitting - Scoot to Edge of Bed Supine to Sit: 4: Min guard;With rails Sitting - Scoot to Edge of Bed: 4: Min guard Details for Bed Mobility Assistance: minguard for safety with cues for technique Transfers Transfers: Sit to Stand;Stand to Sit Sit to Stand: 4: Min guard;From bed;With armrests;From chair/3-in-1 Stand to Sit: 4: Min guard;To chair/3-in-1 Details for Transfer Assistance: Minguard for safety with cues for hand placement and LE placement.      Exercise     Balance     End of Session OT - End of Session Equipment Utilized During Treatment: Rolling walker;Gait belt Activity Tolerance: Patient tolerated treatment well Patient left: in chair;with call bell/phone within reach   [employer]  OTR/L [phone]  11/28/2012, 10:29 AM

## 2012-11-28 NOTE — Progress Notes (Signed)
Physical Therapy Treatment Patient Details Name: Emily Ayers MRN: [id number] DOB: 20-Jul-1950 Today's Date: 11/28/2012 Time: 6213-0865 PT Time Calculation (min): 48 min  PT Assessment / Plan / Recommendation  PT Comments   Pt continues to make great progress.  Pt able to increase ambulation distance this session and complete stair negotiation.  Plan to practice steps once more with cousin to assist to prepare for safe d/c home.   Follow Up Recommendations  Home health PT;Supervision/Assistance - 24 hour     Does the patient have the potential to tolerate intense rehabilitation     Barriers to Discharge        Equipment Recommendations  Rolling walker with 5" wheels;3in1 (PT)    Recommendations for Other Services    Frequency 7X/week   Progress towards PT Goals Progress towards PT goals: Progressing toward goals  Plan Current plan remains appropriate    Precautions / Restrictions Precautions Precautions: Knee Precaution Booklet Issued: No Precaution Comments: Reviewed precautions with patient. Restrictions Weight Bearing Restrictions: Yes LLE Weight Bearing: Weight bearing as tolerated   Pertinent Vitals/Pain 2/10 left knee pain;   Mobility  Bed Mobility Bed Mobility: Supine to Sit;Sitting - Scoot to Edge of Bed;Sit to Supine Supine to Sit: 4: Min guard;With rails Sitting - Scoot to Edge of Bed: 4: Min guard Sit to Supine: 4: Min assist Details for Bed Mobility Assistance: (A) to elevate LLE into bed Transfers Transfers: Sit to Stand;Stand to Sit;Stand Pivot Transfers Sit to Stand: 4: Min guard;From bed;With armrests;From chair/3-in-1 Stand to Sit: 4: Min guard;To chair/3-in-1 Details for Transfer Assistance: Minguard for safety with cues for hand placement and LE placement.  Ambulation/Gait Ambulation/Gait Assistance: 4: Min guard Ambulation Distance (Feet): 200 Feet Assistive device: Rolling walker Ambulation/Gait Assistance Details: Minguard for safety.  Pt  able to intiiate step through gait with VCs Gait Pattern: Step-to pattern;Step-through pattern Gait velocity: decreased Stairs: Yes Stairs Assistance: 4: Min assist Stairs Assistance Details (indicate cue type and reason): (A) to manage RW with cues for step sequence and RW placement Stair Management Technique: Step to pattern;Backwards;With walker Number of Stairs: 2    Exercises Total Joint Exercises Ankle Circles/Pumps: AROM;Both;10 reps;Seated Quad Sets: Strengthening;Left;10 reps;Supine Heel Slides: Strengthening;Left;10 reps   PT Diagnosis:    PT Problem List:   PT Treatment Interventions:     PT Goals (current goals can now be found in the care plan section) Acute Rehab PT Goals Patient Stated Goal: To go home tomorrow PT Goal Formulation: With patient Time For Goal Achievement: 12/04/12 Potential to Achieve Goals: Good  Visit Information  Last PT Received On: 11/28/12 Assistance Needed: +1 History of Present Illness: Patient is a 62 yo female s/p Lt. TKA.    Subjective Data  Subjective: "I'm ready to go again." Patient Stated Goal: To go home tomorrow   Cognition  Cognition Arousal/Alertness: Awake/alert Behavior During Therapy: WFL for tasks assessed/performed Overall Cognitive Status: Within Functional Limits for tasks assessed    Balance     End of Session PT - End of Session Equipment Utilized During Treatment: Gait belt Activity Tolerance: Patient tolerated treatment well Patient left: in bed;in CPM;with call bell/phone within reach;with family/visitor present Nurse Communication: Mobility status CPM Left Knee CPM Left Knee: On Left Knee Flexion (Degrees): 90 Left Knee Extension (Degrees): 0   GP     Levii Hairfield 11/28/2012, 3:42 PM  Jake Shark, PT DPT [phone]

## 2012-11-29 ENCOUNTER — Encounter (HOSPITAL_COMMUNITY): Payer: Self-pay | Admitting: General Practice

## 2012-11-29 LAB — CBC
HCT: 24.9 % — ABNORMAL LOW (ref 36.0–46.0)
Hemoglobin: 8.9 g/dL — ABNORMAL LOW (ref 12.0–15.0)
MCH: 31.9 pg (ref 26.0–34.0)
MCHC: 35.7 g/dL (ref 30.0–36.0)
MCV: 89.2 fL (ref 78.0–100.0)
Platelets: 167 10*3/uL (ref 150–400)
RBC: 2.79 MIL/uL — ABNORMAL LOW (ref 3.87–5.11)
RDW: 12.5 % (ref 11.5–15.5)
WBC: 7.7 10*3/uL (ref 4.0–10.5)

## 2012-11-29 LAB — BASIC METABOLIC PANEL
BUN: 13 mg/dL (ref 6–23)
CO2: 29 mEq/L (ref 19–32)
Calcium: 8.6 mg/dL (ref 8.4–10.5)
Chloride: 99 mEq/L (ref 96–112)
Creatinine, Ser: 0.92 mg/dL (ref 0.50–1.10)
GFR calc Af Amer: 75 mL/min — ABNORMAL LOW (ref >90)
GFR calc non Af Amer: 64 mL/min — ABNORMAL LOW (ref >90)
Glucose, Bld: 125 mg/dL — ABNORMAL HIGH (ref 70–99)
Potassium: 3.2 mEq/L — ABNORMAL LOW (ref 3.5–5.1)
Sodium: 136 mEq/L (ref 135–145)
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
## 2012-11-29 NOTE — Progress Notes (Signed)
Physical Therapy Treatment Patient Details Name: Emily Ayers MRN: [id number] DOB: 1951-03-23 Today's Date: 11/29/2012 Time: 4098-1191 PT Time Calculation (min): 38 min  PT Assessment / Plan / Recommendation  PT Comments   Pt demonstrates supervision to modified independence for all functional mobility and is prepare physically to return home.  Has excellent support for d/c and is ready to go today if medically appropriate.     Follow Up Recommendations  Home health PT;Supervision/Assistance - 24 hour     Does the patient have the potential to tolerate intense rehabilitation     Barriers to Discharge        Equipment Recommendations  Rolling walker with 5" wheels;3in1 (PT)    Recommendations for Other Services    Frequency 7X/week   Progress towards PT Goals Progress towards PT goals: Progressing toward goals  Plan Current plan remains appropriate    Precautions / Restrictions Precautions Precautions: Knee Precaution Comments: demonstrates understanding of precautions Restrictions Weight Bearing Restrictions: Yes LLE Weight Bearing: Weight bearing as tolerated Other Position/Activity Restrictions: no pillow under left knee   Pertinent Vitals/Pain Soreness, minimal pain    Mobility  Bed Mobility Bed Mobility: Sit to Supine Sit to Supine: 5: Supervision;HOB flat Details for Bed Mobility Assistance: verbal instruction to self-assist LLE onto bed surface; performs without physical assistance Transfers Transfers: Sit to Stand;Stand to Sit Sit to Stand: 6: Modified independent (Device/Increase time);From elevated surface;With upper extremity assist;With armrests;From bed;From chair/3-in-1 Stand to Sit: 6: Modified independent (Device/Increase time);With upper extremity assist;To elevated surface;To bed;With armrests;To chair/3-in-1 Details for Transfer Assistance: confirmational cues for technique during transitions, physically able from bed and 3-in-1 without  instruction or assist Ambulation/Gait Ambulation/Gait Assistance: 5: Supervision Ambulation Distance (Feet): 200 Feet Assistive device: Rolling walker Ambulation/Gait Assistance Details: verbal instruction to transition to step-through pattern and optimal sequencing for load bearing managment using RW Gait Pattern: Step-through pattern;Decreased stance time - left General Gait Details: initially step-to and unable to consistently time left leg with walker optimally; easly integrates verbal instruction into more normalized gait pattern as above; d/w pt goal to transition from RW to less restrictive device and importance of use of RW unless with therapy until PT indicates otherwise, pt verbalizes agreement and understanding    Exercises Total Joint Exercises Ankle Circles/Pumps: AROM;Both;10 reps;Supine Quad Sets: Strengthening;Both;10 reps;Supine Heel Slides: Strengthening;Left;10 reps;Supine Straight Leg Raises: AROM;AAROM;Left;5 reps;Supine Long Arc Quad: Strengthening;Left;10 reps;Seated Knee Flexion: AAROM;Left;5 reps;Supine Goniometric ROM: 95   PT Diagnosis:    PT Problem List:   PT Treatment Interventions:     PT Goals (current goals can now be found in the care plan section) Acute Rehab PT Goals Patient Stated Goal: Walk 'normally' without pain in legs anywhere; go shopping with cousin  Visit Information  Last PT Received On: 11/29/12 Assistance Needed: +1 History of Present Illness: Patient is a 62 yo female s/p Lt. TKA.    Subjective Data  Subjective: I feel great! Patient Stated Goal: Walk 'normally' without pain in legs anywhere; go shopping with cousin   Cognition  Cognition Arousal/Alertness: Awake/alert Behavior During Therapy: WFL for tasks assessed/performed Overall Cognitive Status: Within Functional Limits for tasks assessed    Balance     End of Session PT - End of Session Equipment Utilized During Treatment: Gait belt Activity Tolerance: Patient  tolerated treatment well Patient left: in bed;with call bell/phone within reach;with family/visitor present Nurse Communication: Mobility status CPM Left Knee CPM Left Knee: On   GP     Narda Amber  Donzetta Matters 11/29/2012, 10:46 AM

## 2012-11-29 NOTE — Progress Notes (Signed)
Occupational Therapy Treatment Patient Details Name: Emily Ayers MRN: [id number] DOB: 10/20/1950 Today's Date: 11/29/2012 Time: 4098-1191 OT Time Calculation (min): 29 min  OT Assessment / Plan / Recommendation  OT comments  Pt did well in session. Practiced shower/tub transfers as well as toileting and LB dressing. Pt felt comfortable with everything covered.   Follow Up Recommendations  Supervision/Assistance - 24 hour;No OT follow up    Barriers to Discharge       Equipment Recommendations  3 in 1 bedside comode    Recommendations for Other Services    Frequency Min 2X/week   Progress towards OT Goals Progress towards OT goals: Progressing toward goals  Plan Discharge plan remains appropriate    Precautions / Restrictions Precautions Precautions: Knee Precaution Booklet Issued: No Precaution Comments: reviewed precautions with pt Restrictions Weight Bearing Restrictions: Yes LLE Weight Bearing: Weight bearing as tolerated Other Position/Activity Restrictions: no pillow under left knee   Pertinent Vitals/Pain Pain 4/10. Repositioned. Nurse brought pain meds during session.     ADL  Grooming: Performed;Wash/dry hands;Supervision/safety Where Assessed - Grooming: Unsupported standing Lower Body Dressing: Performed;Min guard Where Assessed - Lower Body Dressing: Supported sit to Pharmacist, hospital: Research scientist (life sciences) Method: Sit to Barista: Raised toilet seat with arms (or 3-in-1 over toilet) Toileting - Clothing Manipulation and Hygiene: Min guard Where Assessed - Toileting Clothing Manipulation and Hygiene: Sit to stand from 3-in-1 or toilet Tub/Shower Transfer: Simulated;Minimal assistance;Min guard Tub/Shower Transfer Method: Science writer: Walk in shower;Other (comment) (3 in 1; also simulated tub transfer) Equipment Used: Gait belt;Rolling walker Transfers/Ambulation Related to  ADLs: Supervision level. Minguard/Min A for shower transfer. ADL Comments: Pt practiced simulated shower transfer by backing to 3 in 1 and also side stepping due to pt unsure if walker will fit in shower by backing straight in. Min A for side stepping technique and maneuvering walker and technique. Pt at Minguard level for backing to 3 in 1 and stepping over threshold as well as simulated tub transfer (backing to 3 in 1). Educated to have someone with her when doing this at home. Also educated to have chair or bed behind her with walker in front with pulling up pants/underwear and someone with her. Pt agreeable. Pt able do don/doff sock and pants at minguard level.     OT Diagnosis:    OT Problem List:   OT Treatment Interventions:     OT Goals(current goals can now be found in the care plan section) Acute Rehab OT Goals Patient Stated Goal: not stated OT Goal Formulation: With patient Time For Goal Achievement: 12/05/12 Potential to Achieve Goals: Good ADL Goals Pt Will Perform Grooming: with modified independence;standing Pt Will Perform Lower Body Bathing: with modified independence;sit to/from stand Pt Will Perform Lower Body Dressing: with modified independence;sit to/from stand Pt Will Transfer to Toilet: with modified independence;bedside commode Pt Will Perform Toileting - Clothing Manipulation and hygiene: with modified independence;sit to/from stand Pt Will Perform Tub/Shower Transfer: Shower transfer;with supervision;ambulating;3 in 1   Visit Information  Last OT Received On: 11/29/12 Assistance Needed: +1 History of Present Illness: Patient is a 62 yo female s/p Lt. TKA.    Subjective Data      Prior Functioning  Home Living Family/patient expects to be discharged to:: Private residence Living Arrangements: Alone    Cognition  Cognition Arousal/Alertness: Awake/alert Behavior During Therapy: WFL for tasks assessed/performed Overall Cognitive Status: Within  Functional Limits for tasks assessed    Mobility  Bed Mobility Bed Mobility: Supine to Sit;Sitting - Scoot to Edge of Bed;Sit to Supine Supine to Sit: 5: Supervision;HOB flat Sitting - Scoot to Edge of Bed: 5: Supervision Sit to Supine: 5: Supervision Details for Bed Mobility Assistance: supervision for safety Transfers Transfers: Sit to Stand;Stand to Sit Sit to Stand: 5: Supervision;With upper extremity assist;From chair/3-in-1;From bed Stand to Sit: 5: Supervision;With upper extremity assist;To chair/3-in-1;To bed Details for Transfer Assistance: supervision for safety       Balance     End of Session OT - End of Session Equipment Utilized During Treatment: Gait belt;Rolling walker Activity Tolerance: Patient tolerated treatment well Patient left: in bed;with call bell/phone within reach  GO      Earlie Raveling OTR/L [phone] 11/29/2012, 1:59 PM

## 2013-03-30 ENCOUNTER — Telehealth: Payer: Self-pay | Admitting: Obstetrics & Gynecology

## 2013-03-30 NOTE — Telephone Encounter (Signed)
LMTCB advised that her appt has been rescheduled to 12/15 @ 1pm.

## 2013-04-05 ENCOUNTER — Ambulatory Visit: Payer: Self-pay | Admitting: Obstetrics & Gynecology

## 2013-04-30 ENCOUNTER — Ambulatory Visit: Payer: Self-pay | Admitting: Obstetrics & Gynecology

## 2013-05-07 ENCOUNTER — Telehealth: Payer: Self-pay | Admitting: Obstetrics & Gynecology

## 2013-05-07 NOTE — Telephone Encounter (Signed)
Premarin and Vit D. Patient request refills called to [facility].

## 2013-05-15 MED ORDER — ERGOCALCIFEROL 1.25 MG (50000 UT) PO CAPS: 50000.0000 [IU] | ORAL_CAPSULE | ORAL | Status: DC

## 2013-05-15 MED ORDER — ESTROGENS CONJUGATED 0.3 MG PO TABS: 0.3000 mg | ORAL_TABLET | Freq: Every day | ORAL | Status: DC

## 2013-05-15 NOTE — Telephone Encounter (Signed)
AEX: 06/11/13 @ 2:45 Last Refilled: 02/17/12 Vitamin D 50,000 #26/1 refill 02/17/12 Premarin 0.3 mg #90 x 1 year refills  Vitamin D 50,000 #4/0refills sent to last patient until AEX Premarin 0.3 mg #30/0refills sent to last patient until AEX  Patient notified.

## 2013-06-11 ENCOUNTER — Encounter: Payer: Self-pay | Admitting: Obstetrics & Gynecology

## 2013-06-11 ENCOUNTER — Ambulatory Visit (INDEPENDENT_AMBULATORY_CARE_PROVIDER_SITE_OTHER): Payer: BC Managed Care – PPO | Admitting: Obstetrics & Gynecology

## 2013-06-11 VITALS — BP 102/66 | HR 60 | Resp 16 | Ht 67.25 in | Wt 177.8 lb

## 2013-06-11 DIAGNOSIS — Z0000 Encounter for general adult medical examination without abnormal findings: Secondary | ICD-10-CM

## 2013-06-11 DIAGNOSIS — Z01419 Encounter for gynecological examination (general) (routine) without abnormal findings: Secondary | ICD-10-CM

## 2013-06-11 LAB — CBC
HCT: 35.6 % — ABNORMAL LOW (ref 36.0–46.0)
Hemoglobin: 12.2 g/dL (ref 12.0–15.0)
MCH: 30.5 pg (ref 26.0–34.0)
MCHC: 34.3 g/dL (ref 30.0–36.0)
MCV: 89 fL (ref 78.0–100.0)
Platelets: 328 10*3/uL (ref 150–400)
RBC: 4 MIL/uL (ref 3.87–5.11)
RDW: 13.7 % (ref 11.5–15.5)
WBC: 7.6 10*3/uL (ref 4.0–10.5)

## 2013-06-11 LAB — HEMOGLOBIN, FINGERSTICK: Hemoglobin, fingerstick: 12.1 g/dL (ref 12.0–16.0)

## 2013-06-11 MED ORDER — ERGOCALCIFEROL 1.25 MG (50000 UT) PO CAPS: 50000.0000 [IU] | ORAL_CAPSULE | ORAL | Status: DC
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
## 2013-06-11 NOTE — Patient Instructions (Signed)

## 2013-06-28 DIAGNOSIS — Z96659 Presence of unspecified artificial knee joint: Secondary | ICD-10-CM | POA: Diagnosis not present

## 2013-07-05 ENCOUNTER — Telehealth: Payer: Self-pay | Admitting: Obstetrics & Gynecology

## 2013-07-05 NOTE — Telephone Encounter (Signed)
Patient needs a diagnosis code to schedule her BMD.

## 2013-07-06 DIAGNOSIS — I209 Angina pectoris, unspecified: Secondary | ICD-10-CM | POA: Diagnosis not present

## 2013-07-06 DIAGNOSIS — E785 Hyperlipidemia, unspecified: Secondary | ICD-10-CM | POA: Diagnosis not present

## 2013-07-06 DIAGNOSIS — I10 Essential (primary) hypertension: Secondary | ICD-10-CM | POA: Diagnosis not present

## 2013-07-06 NOTE — Telephone Encounter (Signed)
Spoke with patient, advised screening for osteoporosis V82.81.  Spoke with [facility] hospital scheduler and advised as well. Patient will call back to schedule appointment Routing to provider for final review. Patient agreeable to disposition. Will close encounter

## 2013-07-09 DIAGNOSIS — R079 Chest pain, unspecified: Secondary | ICD-10-CM | POA: Diagnosis not present

## 2013-07-16 DIAGNOSIS — R0789 Other chest pain: Secondary | ICD-10-CM | POA: Diagnosis not present

## 2013-07-16 DIAGNOSIS — Z0000 Encounter for general adult medical examination without abnormal findings: Secondary | ICD-10-CM | POA: Diagnosis not present

## 2013-07-16 DIAGNOSIS — E785 Hyperlipidemia, unspecified: Secondary | ICD-10-CM | POA: Diagnosis not present

## 2013-07-16 DIAGNOSIS — R079 Chest pain, unspecified: Secondary | ICD-10-CM | POA: Diagnosis not present

## 2013-07-16 DIAGNOSIS — I10 Essential (primary) hypertension: Secondary | ICD-10-CM | POA: Diagnosis not present

## 2013-07-16 DIAGNOSIS — I209 Angina pectoris, unspecified: Secondary | ICD-10-CM | POA: Diagnosis not present

## 2013-07-18 DIAGNOSIS — Z79899 Other long term (current) drug therapy: Secondary | ICD-10-CM | POA: Diagnosis not present

## 2013-07-18 DIAGNOSIS — I2510 Atherosclerotic heart disease of native coronary artery without angina pectoris: Secondary | ICD-10-CM | POA: Diagnosis not present

## 2013-07-18 DIAGNOSIS — I10 Essential (primary) hypertension: Secondary | ICD-10-CM | POA: Diagnosis not present

## 2013-07-18 DIAGNOSIS — E785 Hyperlipidemia, unspecified: Secondary | ICD-10-CM | POA: Diagnosis not present

## 2013-07-18 DIAGNOSIS — I209 Angina pectoris, unspecified: Secondary | ICD-10-CM | POA: Diagnosis not present

## 2013-07-19 DIAGNOSIS — I209 Angina pectoris, unspecified: Secondary | ICD-10-CM | POA: Diagnosis not present

## 2013-07-19 DIAGNOSIS — I10 Essential (primary) hypertension: Secondary | ICD-10-CM | POA: Diagnosis not present

## 2013-07-19 DIAGNOSIS — Z9861 Coronary angioplasty status: Secondary | ICD-10-CM | POA: Diagnosis not present

## 2013-07-19 DIAGNOSIS — I2510 Atherosclerotic heart disease of native coronary artery without angina pectoris: Secondary | ICD-10-CM | POA: Diagnosis not present

## 2013-07-19 DIAGNOSIS — E785 Hyperlipidemia, unspecified: Secondary | ICD-10-CM | POA: Diagnosis not present

## 2013-07-19 DIAGNOSIS — Z79899 Other long term (current) drug therapy: Secondary | ICD-10-CM | POA: Diagnosis not present

## 2013-07-23 DIAGNOSIS — N6009 Solitary cyst of unspecified breast: Secondary | ICD-10-CM | POA: Diagnosis not present

## 2013-07-23 DIAGNOSIS — R928 Other abnormal and inconclusive findings on diagnostic imaging of breast: Secondary | ICD-10-CM | POA: Diagnosis not present

## 2013-07-23 DIAGNOSIS — Z13820 Encounter for screening for osteoporosis: Secondary | ICD-10-CM | POA: Diagnosis not present

## 2013-07-23 DIAGNOSIS — N6019 Diffuse cystic mastopathy of unspecified breast: Secondary | ICD-10-CM | POA: Diagnosis not present

## 2013-08-01 DIAGNOSIS — M949 Disorder of cartilage, unspecified: Secondary | ICD-10-CM | POA: Diagnosis not present

## 2013-08-01 DIAGNOSIS — M899 Disorder of bone, unspecified: Secondary | ICD-10-CM | POA: Diagnosis not present

## 2013-08-01 DIAGNOSIS — Z13820 Encounter for screening for osteoporosis: Secondary | ICD-10-CM | POA: Diagnosis not present

## 2013-08-02 DIAGNOSIS — R079 Chest pain, unspecified: Secondary | ICD-10-CM | POA: Diagnosis not present

## 2013-08-02 DIAGNOSIS — I2510 Atherosclerotic heart disease of native coronary artery without angina pectoris: Secondary | ICD-10-CM | POA: Diagnosis not present

## 2013-08-02 DIAGNOSIS — J45909 Unspecified asthma, uncomplicated: Secondary | ICD-10-CM | POA: Diagnosis not present

## 2013-08-02 DIAGNOSIS — I10 Essential (primary) hypertension: Secondary | ICD-10-CM | POA: Diagnosis not present

## 2013-08-02 DIAGNOSIS — E785 Hyperlipidemia, unspecified: Secondary | ICD-10-CM | POA: Diagnosis not present

## 2013-08-07 DIAGNOSIS — I10 Essential (primary) hypertension: Secondary | ICD-10-CM | POA: Diagnosis not present

## 2013-08-07 DIAGNOSIS — Z9861 Coronary angioplasty status: Secondary | ICD-10-CM | POA: Diagnosis not present

## 2013-08-07 DIAGNOSIS — Z7982 Long term (current) use of aspirin: Secondary | ICD-10-CM | POA: Diagnosis not present

## 2013-08-07 DIAGNOSIS — E785 Hyperlipidemia, unspecified: Secondary | ICD-10-CM | POA: Diagnosis not present

## 2013-08-07 DIAGNOSIS — Z79899 Other long term (current) drug therapy: Secondary | ICD-10-CM | POA: Diagnosis not present

## 2013-08-07 DIAGNOSIS — Z5189 Encounter for other specified aftercare: Secondary | ICD-10-CM | POA: Diagnosis not present

## 2013-08-10 DIAGNOSIS — E785 Hyperlipidemia, unspecified: Secondary | ICD-10-CM | POA: Diagnosis not present

## 2013-08-10 DIAGNOSIS — Z79899 Other long term (current) drug therapy: Secondary | ICD-10-CM | POA: Diagnosis not present

## 2013-08-10 DIAGNOSIS — Z5189 Encounter for other specified aftercare: Secondary | ICD-10-CM | POA: Diagnosis not present

## 2013-08-10 DIAGNOSIS — Z7982 Long term (current) use of aspirin: Secondary | ICD-10-CM | POA: Diagnosis not present

## 2013-08-10 DIAGNOSIS — Z9861 Coronary angioplasty status: Secondary | ICD-10-CM | POA: Diagnosis not present

## 2013-08-10 DIAGNOSIS — I10 Essential (primary) hypertension: Secondary | ICD-10-CM | POA: Diagnosis not present

## 2013-08-20 DIAGNOSIS — I10 Essential (primary) hypertension: Secondary | ICD-10-CM | POA: Diagnosis not present

## 2013-08-20 DIAGNOSIS — Z9861 Coronary angioplasty status: Secondary | ICD-10-CM | POA: Diagnosis not present

## 2013-08-20 DIAGNOSIS — Z7982 Long term (current) use of aspirin: Secondary | ICD-10-CM | POA: Diagnosis not present

## 2013-08-20 DIAGNOSIS — Z79899 Other long term (current) drug therapy: Secondary | ICD-10-CM | POA: Diagnosis not present

## 2013-08-20 DIAGNOSIS — E785 Hyperlipidemia, unspecified: Secondary | ICD-10-CM | POA: Diagnosis not present

## 2013-08-20 DIAGNOSIS — Z5189 Encounter for other specified aftercare: Secondary | ICD-10-CM | POA: Diagnosis not present

## 2013-08-30 DIAGNOSIS — I2510 Atherosclerotic heart disease of native coronary artery without angina pectoris: Secondary | ICD-10-CM | POA: Diagnosis not present

## 2013-08-30 DIAGNOSIS — R000 Tachycardia, unspecified: Secondary | ICD-10-CM | POA: Diagnosis not present

## 2013-08-30 DIAGNOSIS — I10 Essential (primary) hypertension: Secondary | ICD-10-CM | POA: Diagnosis not present

## 2013-08-30 DIAGNOSIS — E785 Hyperlipidemia, unspecified: Secondary | ICD-10-CM | POA: Diagnosis not present

## 2013-08-30 DIAGNOSIS — I209 Angina pectoris, unspecified: Secondary | ICD-10-CM | POA: Diagnosis not present

## 2013-09-10 DIAGNOSIS — Z6827 Body mass index (BMI) 27.0-27.9, adult: Secondary | ICD-10-CM | POA: Diagnosis not present

## 2013-09-10 DIAGNOSIS — R21 Rash and other nonspecific skin eruption: Secondary | ICD-10-CM | POA: Diagnosis not present

## 2013-09-14 DIAGNOSIS — I708 Atherosclerosis of other arteries: Secondary | ICD-10-CM | POA: Diagnosis not present

## 2013-09-14 DIAGNOSIS — Z79899 Other long term (current) drug therapy: Secondary | ICD-10-CM | POA: Diagnosis not present

## 2013-09-14 DIAGNOSIS — Z7982 Long term (current) use of aspirin: Secondary | ICD-10-CM | POA: Diagnosis not present

## 2013-09-14 DIAGNOSIS — Z5189 Encounter for other specified aftercare: Secondary | ICD-10-CM | POA: Diagnosis not present

## 2013-09-14 DIAGNOSIS — Z9861 Coronary angioplasty status: Secondary | ICD-10-CM | POA: Diagnosis not present

## 2013-09-14 DIAGNOSIS — I10 Essential (primary) hypertension: Secondary | ICD-10-CM | POA: Diagnosis not present

## 2013-09-14 DIAGNOSIS — E785 Hyperlipidemia, unspecified: Secondary | ICD-10-CM | POA: Diagnosis not present

## 2013-10-15 DIAGNOSIS — Z7982 Long term (current) use of aspirin: Secondary | ICD-10-CM | POA: Diagnosis not present

## 2013-10-15 DIAGNOSIS — Z5189 Encounter for other specified aftercare: Secondary | ICD-10-CM | POA: Diagnosis not present

## 2013-10-15 DIAGNOSIS — E785 Hyperlipidemia, unspecified: Secondary | ICD-10-CM | POA: Diagnosis not present

## 2013-10-15 DIAGNOSIS — Z9861 Coronary angioplasty status: Secondary | ICD-10-CM | POA: Diagnosis not present

## 2013-10-15 DIAGNOSIS — I10 Essential (primary) hypertension: Secondary | ICD-10-CM | POA: Diagnosis not present

## 2013-10-15 DIAGNOSIS — Z79899 Other long term (current) drug therapy: Secondary | ICD-10-CM | POA: Diagnosis not present

## 2013-10-15 DIAGNOSIS — I708 Atherosclerosis of other arteries: Secondary | ICD-10-CM | POA: Diagnosis not present

## 2013-11-14 DIAGNOSIS — Z7982 Long term (current) use of aspirin: Secondary | ICD-10-CM | POA: Diagnosis not present

## 2013-11-14 DIAGNOSIS — E785 Hyperlipidemia, unspecified: Secondary | ICD-10-CM | POA: Diagnosis not present

## 2013-11-14 DIAGNOSIS — Z79899 Other long term (current) drug therapy: Secondary | ICD-10-CM | POA: Diagnosis not present

## 2013-11-14 DIAGNOSIS — Z5189 Encounter for other specified aftercare: Secondary | ICD-10-CM | POA: Diagnosis not present

## 2013-11-14 DIAGNOSIS — I10 Essential (primary) hypertension: Secondary | ICD-10-CM | POA: Diagnosis not present

## 2013-11-14 DIAGNOSIS — Z9861 Coronary angioplasty status: Secondary | ICD-10-CM | POA: Diagnosis not present

## 2013-11-14 DIAGNOSIS — I708 Atherosclerosis of other arteries: Secondary | ICD-10-CM | POA: Diagnosis not present

## 2013-11-29 DIAGNOSIS — Z471 Aftercare following joint replacement surgery: Secondary | ICD-10-CM | POA: Diagnosis not present

## 2013-11-29 DIAGNOSIS — Z96659 Presence of unspecified artificial knee joint: Secondary | ICD-10-CM | POA: Diagnosis not present

## 2013-11-29 DIAGNOSIS — M25562 Pain in left knee: Secondary | ICD-10-CM | POA: Insufficient documentation

## 2013-11-29 DIAGNOSIS — M25569 Pain in unspecified knee: Secondary | ICD-10-CM | POA: Diagnosis not present

## 2013-12-11 DIAGNOSIS — R0789 Other chest pain: Secondary | ICD-10-CM | POA: Diagnosis not present

## 2013-12-11 DIAGNOSIS — I2510 Atherosclerotic heart disease of native coronary artery without angina pectoris: Secondary | ICD-10-CM | POA: Diagnosis not present

## 2013-12-11 DIAGNOSIS — I10 Essential (primary) hypertension: Secondary | ICD-10-CM | POA: Diagnosis not present

## 2013-12-11 DIAGNOSIS — E785 Hyperlipidemia, unspecified: Secondary | ICD-10-CM | POA: Diagnosis not present

## 2014-01-04 DIAGNOSIS — H26499 Other secondary cataract, unspecified eye: Secondary | ICD-10-CM | POA: Diagnosis not present

## 2014-03-11 DIAGNOSIS — E785 Hyperlipidemia, unspecified: Secondary | ICD-10-CM | POA: Diagnosis not present

## 2014-03-11 DIAGNOSIS — R0789 Other chest pain: Secondary | ICD-10-CM | POA: Diagnosis not present

## 2014-03-11 DIAGNOSIS — I2510 Atherosclerotic heart disease of native coronary artery without angina pectoris: Secondary | ICD-10-CM | POA: Diagnosis not present

## 2014-03-11 DIAGNOSIS — I10 Essential (primary) hypertension: Secondary | ICD-10-CM | POA: Diagnosis not present

## 2014-03-18 ENCOUNTER — Encounter: Payer: Self-pay | Admitting: Obstetrics & Gynecology

## 2014-04-25 DIAGNOSIS — I2510 Atherosclerotic heart disease of native coronary artery without angina pectoris: Secondary | ICD-10-CM | POA: Diagnosis not present

## 2014-04-25 DIAGNOSIS — E785 Hyperlipidemia, unspecified: Secondary | ICD-10-CM | POA: Diagnosis not present

## 2014-05-21 DIAGNOSIS — Z6828 Body mass index (BMI) 28.0-28.9, adult: Secondary | ICD-10-CM | POA: Diagnosis not present

## 2014-05-21 DIAGNOSIS — E785 Hyperlipidemia, unspecified: Secondary | ICD-10-CM | POA: Diagnosis not present

## 2014-05-21 DIAGNOSIS — D649 Anemia, unspecified: Secondary | ICD-10-CM | POA: Diagnosis not present

## 2014-05-21 DIAGNOSIS — Z760 Encounter for issue of repeat prescription: Secondary | ICD-10-CM | POA: Diagnosis not present

## 2014-05-21 DIAGNOSIS — I2510 Atherosclerotic heart disease of native coronary artery without angina pectoris: Secondary | ICD-10-CM | POA: Diagnosis not present

## 2014-05-21 DIAGNOSIS — Z23 Encounter for immunization: Secondary | ICD-10-CM | POA: Diagnosis not present

## 2014-05-21 DIAGNOSIS — J45909 Unspecified asthma, uncomplicated: Secondary | ICD-10-CM | POA: Diagnosis not present

## 2014-05-21 DIAGNOSIS — M8580 Other specified disorders of bone density and structure, unspecified site: Secondary | ICD-10-CM | POA: Diagnosis not present

## 2014-06-18 DIAGNOSIS — E785 Hyperlipidemia, unspecified: Secondary | ICD-10-CM | POA: Diagnosis not present

## 2014-06-24 ENCOUNTER — Ambulatory Visit: Payer: BC Managed Care – PPO | Admitting: Obstetrics & Gynecology

## 2014-06-24 DIAGNOSIS — R0789 Other chest pain: Secondary | ICD-10-CM | POA: Diagnosis not present

## 2014-06-24 DIAGNOSIS — I2510 Atherosclerotic heart disease of native coronary artery without angina pectoris: Secondary | ICD-10-CM | POA: Diagnosis not present

## 2014-06-24 DIAGNOSIS — I10 Essential (primary) hypertension: Secondary | ICD-10-CM | POA: Diagnosis not present

## 2014-06-24 DIAGNOSIS — E785 Hyperlipidemia, unspecified: Secondary | ICD-10-CM | POA: Diagnosis not present

## 2014-06-24 DIAGNOSIS — I209 Angina pectoris, unspecified: Secondary | ICD-10-CM | POA: Diagnosis not present

## 2014-06-24 DIAGNOSIS — R079 Chest pain, unspecified: Secondary | ICD-10-CM | POA: Diagnosis not present

## 2014-09-23 DIAGNOSIS — J029 Acute pharyngitis, unspecified: Secondary | ICD-10-CM | POA: Diagnosis not present

## 2014-09-23 DIAGNOSIS — J387 Other diseases of larynx: Secondary | ICD-10-CM | POA: Diagnosis not present

## 2014-09-23 DIAGNOSIS — Z6828 Body mass index (BMI) 28.0-28.9, adult: Secondary | ICD-10-CM | POA: Diagnosis not present

## 2014-11-11 ENCOUNTER — Other Ambulatory Visit: Payer: Self-pay

## 2014-11-21 DIAGNOSIS — I2510 Atherosclerotic heart disease of native coronary artery without angina pectoris: Secondary | ICD-10-CM | POA: Diagnosis not present

## 2014-11-21 DIAGNOSIS — Z1231 Encounter for screening mammogram for malignant neoplasm of breast: Secondary | ICD-10-CM | POA: Diagnosis not present

## 2014-11-21 DIAGNOSIS — E559 Vitamin D deficiency, unspecified: Secondary | ICD-10-CM | POA: Diagnosis not present

## 2014-11-21 DIAGNOSIS — E785 Hyperlipidemia, unspecified: Secondary | ICD-10-CM | POA: Diagnosis not present

## 2014-11-21 DIAGNOSIS — Z9181 History of falling: Secondary | ICD-10-CM | POA: Diagnosis not present

## 2014-11-21 DIAGNOSIS — Z6828 Body mass index (BMI) 28.0-28.9, adult: Secondary | ICD-10-CM | POA: Diagnosis not present

## 2014-11-21 DIAGNOSIS — Z79899 Other long term (current) drug therapy: Secondary | ICD-10-CM | POA: Diagnosis not present

## 2014-11-21 DIAGNOSIS — I10 Essential (primary) hypertension: Secondary | ICD-10-CM | POA: Diagnosis not present

## 2014-11-21 DIAGNOSIS — Z1389 Encounter for screening for other disorder: Secondary | ICD-10-CM | POA: Diagnosis not present

## 2014-11-21 DIAGNOSIS — J45909 Unspecified asthma, uncomplicated: Secondary | ICD-10-CM | POA: Diagnosis not present

## 2014-11-22 DIAGNOSIS — Z79899 Other long term (current) drug therapy: Secondary | ICD-10-CM | POA: Diagnosis not present

## 2014-11-22 DIAGNOSIS — E785 Hyperlipidemia, unspecified: Secondary | ICD-10-CM | POA: Diagnosis not present

## 2014-11-22 DIAGNOSIS — E559 Vitamin D deficiency, unspecified: Secondary | ICD-10-CM | POA: Diagnosis not present

## 2014-11-25 DIAGNOSIS — Z1231 Encounter for screening mammogram for malignant neoplasm of breast: Secondary | ICD-10-CM | POA: Diagnosis not present

## 2014-11-25 DIAGNOSIS — Z803 Family history of malignant neoplasm of breast: Secondary | ICD-10-CM | POA: Diagnosis not present

## 2015-01-09 DIAGNOSIS — H26493 Other secondary cataract, bilateral: Secondary | ICD-10-CM | POA: Diagnosis not present

## 2015-01-09 DIAGNOSIS — H04123 Dry eye syndrome of bilateral lacrimal glands: Secondary | ICD-10-CM | POA: Diagnosis not present

## 2015-03-21 DIAGNOSIS — M25461 Effusion, right knee: Secondary | ICD-10-CM | POA: Diagnosis not present

## 2015-03-21 DIAGNOSIS — M25561 Pain in right knee: Secondary | ICD-10-CM | POA: Diagnosis not present

## 2015-03-21 DIAGNOSIS — Z23 Encounter for immunization: Secondary | ICD-10-CM | POA: Diagnosis not present

## 2015-05-28 DIAGNOSIS — E785 Hyperlipidemia, unspecified: Secondary | ICD-10-CM | POA: Diagnosis not present

## 2015-05-28 DIAGNOSIS — I2510 Atherosclerotic heart disease of native coronary artery without angina pectoris: Secondary | ICD-10-CM | POA: Diagnosis not present

## 2015-05-28 DIAGNOSIS — I10 Essential (primary) hypertension: Secondary | ICD-10-CM | POA: Diagnosis not present

## 2015-05-28 DIAGNOSIS — Z6828 Body mass index (BMI) 28.0-28.9, adult: Secondary | ICD-10-CM | POA: Diagnosis not present

## 2015-05-28 DIAGNOSIS — F419 Anxiety disorder, unspecified: Secondary | ICD-10-CM | POA: Diagnosis not present

## 2015-05-28 DIAGNOSIS — E559 Vitamin D deficiency, unspecified: Secondary | ICD-10-CM | POA: Diagnosis not present

## 2015-05-28 DIAGNOSIS — J45909 Unspecified asthma, uncomplicated: Secondary | ICD-10-CM | POA: Diagnosis not present

## 2015-05-28 DIAGNOSIS — Z79899 Other long term (current) drug therapy: Secondary | ICD-10-CM | POA: Diagnosis not present

## 2015-05-29 DIAGNOSIS — Z6828 Body mass index (BMI) 28.0-28.9, adult: Secondary | ICD-10-CM | POA: Diagnosis not present

## 2015-05-29 DIAGNOSIS — I10 Essential (primary) hypertension: Secondary | ICD-10-CM | POA: Insufficient documentation

## 2015-05-29 DIAGNOSIS — E785 Hyperlipidemia, unspecified: Secondary | ICD-10-CM | POA: Insufficient documentation

## 2015-05-29 DIAGNOSIS — I2510 Atherosclerotic heart disease of native coronary artery without angina pectoris: Secondary | ICD-10-CM | POA: Insufficient documentation

## 2015-05-29 HISTORY — DX: Hyperlipidemia, unspecified: E78.5

## 2015-05-29 HISTORY — DX: Atherosclerotic heart disease of native coronary artery without angina pectoris: I25.10

## 2015-05-29 HISTORY — DX: Essential (primary) hypertension: I10

## 2015-06-17 DIAGNOSIS — I2510 Atherosclerotic heart disease of native coronary artery without angina pectoris: Secondary | ICD-10-CM | POA: Diagnosis not present

## 2015-07-15 DIAGNOSIS — Z6827 Body mass index (BMI) 27.0-27.9, adult: Secondary | ICD-10-CM | POA: Diagnosis not present

## 2015-07-15 DIAGNOSIS — R112 Nausea with vomiting, unspecified: Secondary | ICD-10-CM | POA: Diagnosis not present

## 2015-07-15 DIAGNOSIS — M179 Osteoarthritis of knee, unspecified: Secondary | ICD-10-CM | POA: Diagnosis not present

## 2015-07-15 DIAGNOSIS — R0789 Other chest pain: Secondary | ICD-10-CM | POA: Diagnosis not present

## 2015-07-15 DIAGNOSIS — R1011 Right upper quadrant pain: Secondary | ICD-10-CM | POA: Diagnosis not present

## 2015-07-21 DIAGNOSIS — K8020 Calculus of gallbladder without cholecystitis without obstruction: Secondary | ICD-10-CM | POA: Diagnosis not present

## 2015-07-21 DIAGNOSIS — R1011 Right upper quadrant pain: Secondary | ICD-10-CM | POA: Diagnosis not present

## 2015-08-12 DIAGNOSIS — Z6830 Body mass index (BMI) 30.0-30.9, adult: Secondary | ICD-10-CM | POA: Diagnosis not present

## 2015-08-12 DIAGNOSIS — E669 Obesity, unspecified: Secondary | ICD-10-CM | POA: Diagnosis not present

## 2015-08-12 DIAGNOSIS — K8010 Calculus of gallbladder with chronic cholecystitis without obstruction: Secondary | ICD-10-CM

## 2015-08-12 DIAGNOSIS — R1011 Right upper quadrant pain: Secondary | ICD-10-CM

## 2015-08-12 HISTORY — DX: Right upper quadrant pain: R10.11

## 2015-08-12 HISTORY — DX: Calculus of gallbladder with chronic cholecystitis without obstruction: K80.10

## 2015-08-18 DIAGNOSIS — E785 Hyperlipidemia, unspecified: Secondary | ICD-10-CM | POA: Diagnosis not present

## 2015-08-18 DIAGNOSIS — I10 Essential (primary) hypertension: Secondary | ICD-10-CM | POA: Diagnosis not present

## 2015-08-18 DIAGNOSIS — I2510 Atherosclerotic heart disease of native coronary artery without angina pectoris: Secondary | ICD-10-CM | POA: Diagnosis not present

## 2015-08-18 DIAGNOSIS — Z6827 Body mass index (BMI) 27.0-27.9, adult: Secondary | ICD-10-CM | POA: Diagnosis not present

## 2015-08-19 DIAGNOSIS — I2510 Atherosclerotic heart disease of native coronary artery without angina pectoris: Secondary | ICD-10-CM | POA: Diagnosis not present

## 2015-08-20 DIAGNOSIS — E785 Hyperlipidemia, unspecified: Secondary | ICD-10-CM | POA: Diagnosis not present

## 2015-08-20 DIAGNOSIS — J45909 Unspecified asthma, uncomplicated: Secondary | ICD-10-CM | POA: Diagnosis not present

## 2015-08-20 DIAGNOSIS — K8010 Calculus of gallbladder with chronic cholecystitis without obstruction: Secondary | ICD-10-CM | POA: Diagnosis not present

## 2015-08-20 DIAGNOSIS — F419 Anxiety disorder, unspecified: Secondary | ICD-10-CM | POA: Diagnosis not present

## 2015-08-20 DIAGNOSIS — I110 Hypertensive heart disease with heart failure: Secondary | ICD-10-CM | POA: Diagnosis not present

## 2015-08-20 DIAGNOSIS — Z79899 Other long term (current) drug therapy: Secondary | ICD-10-CM | POA: Diagnosis not present

## 2015-08-20 DIAGNOSIS — I509 Heart failure, unspecified: Secondary | ICD-10-CM | POA: Diagnosis not present

## 2015-08-20 DIAGNOSIS — K219 Gastro-esophageal reflux disease without esophagitis: Secondary | ICD-10-CM | POA: Diagnosis not present

## 2015-08-20 DIAGNOSIS — Z955 Presence of coronary angioplasty implant and graft: Secondary | ICD-10-CM | POA: Diagnosis not present

## 2015-09-09 DIAGNOSIS — Z09 Encounter for follow-up examination after completed treatment for conditions other than malignant neoplasm: Secondary | ICD-10-CM

## 2015-09-09 HISTORY — DX: Encounter for follow-up examination after completed treatment for conditions other than malignant neoplasm: Z09

## 2015-10-29 DIAGNOSIS — Z6827 Body mass index (BMI) 27.0-27.9, adult: Secondary | ICD-10-CM | POA: Diagnosis not present

## 2015-10-29 DIAGNOSIS — J208 Acute bronchitis due to other specified organisms: Secondary | ICD-10-CM | POA: Diagnosis not present

## 2015-10-29 DIAGNOSIS — E663 Overweight: Secondary | ICD-10-CM | POA: Diagnosis not present

## 2015-11-24 DIAGNOSIS — Z6827 Body mass index (BMI) 27.0-27.9, adult: Secondary | ICD-10-CM | POA: Diagnosis not present

## 2015-11-24 DIAGNOSIS — E785 Hyperlipidemia, unspecified: Secondary | ICD-10-CM | POA: Diagnosis not present

## 2015-11-24 DIAGNOSIS — I10 Essential (primary) hypertension: Secondary | ICD-10-CM | POA: Diagnosis not present

## 2015-11-24 DIAGNOSIS — I2510 Atherosclerotic heart disease of native coronary artery without angina pectoris: Secondary | ICD-10-CM | POA: Diagnosis not present

## 2015-11-28 DIAGNOSIS — M8589 Other specified disorders of bone density and structure, multiple sites: Secondary | ICD-10-CM | POA: Diagnosis not present

## 2015-11-28 DIAGNOSIS — E785 Hyperlipidemia, unspecified: Secondary | ICD-10-CM | POA: Diagnosis not present

## 2015-11-28 DIAGNOSIS — E559 Vitamin D deficiency, unspecified: Secondary | ICD-10-CM | POA: Diagnosis not present

## 2015-11-28 DIAGNOSIS — I10 Essential (primary) hypertension: Secondary | ICD-10-CM | POA: Diagnosis not present

## 2015-11-28 DIAGNOSIS — J45909 Unspecified asthma, uncomplicated: Secondary | ICD-10-CM | POA: Diagnosis not present

## 2015-11-28 DIAGNOSIS — Z1231 Encounter for screening mammogram for malignant neoplasm of breast: Secondary | ICD-10-CM | POA: Diagnosis not present

## 2015-11-28 DIAGNOSIS — Z6827 Body mass index (BMI) 27.0-27.9, adult: Secondary | ICD-10-CM | POA: Diagnosis not present

## 2015-11-28 DIAGNOSIS — I2510 Atherosclerotic heart disease of native coronary artery without angina pectoris: Secondary | ICD-10-CM | POA: Diagnosis not present

## 2015-11-28 DIAGNOSIS — Z1389 Encounter for screening for other disorder: Secondary | ICD-10-CM | POA: Diagnosis not present

## 2015-11-28 DIAGNOSIS — Z9181 History of falling: Secondary | ICD-10-CM | POA: Diagnosis not present

## 2015-12-03 DIAGNOSIS — N3001 Acute cystitis with hematuria: Secondary | ICD-10-CM | POA: Diagnosis not present

## 2016-01-21 DIAGNOSIS — H26493 Other secondary cataract, bilateral: Secondary | ICD-10-CM | POA: Diagnosis not present

## 2016-01-21 DIAGNOSIS — H04123 Dry eye syndrome of bilateral lacrimal glands: Secondary | ICD-10-CM | POA: Diagnosis not present

## 2016-03-05 DIAGNOSIS — Z23 Encounter for immunization: Secondary | ICD-10-CM | POA: Diagnosis not present

## 2016-03-23 DIAGNOSIS — Z1231 Encounter for screening mammogram for malignant neoplasm of breast: Secondary | ICD-10-CM | POA: Diagnosis not present

## 2016-06-01 DIAGNOSIS — F419 Anxiety disorder, unspecified: Secondary | ICD-10-CM | POA: Diagnosis not present

## 2016-06-01 DIAGNOSIS — I2510 Atherosclerotic heart disease of native coronary artery without angina pectoris: Secondary | ICD-10-CM | POA: Diagnosis not present

## 2016-06-01 DIAGNOSIS — Z6828 Body mass index (BMI) 28.0-28.9, adult: Secondary | ICD-10-CM | POA: Diagnosis not present

## 2016-06-01 DIAGNOSIS — E785 Hyperlipidemia, unspecified: Secondary | ICD-10-CM | POA: Diagnosis not present

## 2016-06-01 DIAGNOSIS — I10 Essential (primary) hypertension: Secondary | ICD-10-CM | POA: Diagnosis not present

## 2016-06-01 DIAGNOSIS — E559 Vitamin D deficiency, unspecified: Secondary | ICD-10-CM | POA: Diagnosis not present

## 2016-06-01 DIAGNOSIS — M8589 Other specified disorders of bone density and structure, multiple sites: Secondary | ICD-10-CM | POA: Diagnosis not present

## 2016-06-01 DIAGNOSIS — J45909 Unspecified asthma, uncomplicated: Secondary | ICD-10-CM | POA: Diagnosis not present

## 2016-06-08 DIAGNOSIS — I10 Essential (primary) hypertension: Secondary | ICD-10-CM | POA: Diagnosis not present

## 2016-06-08 DIAGNOSIS — R0789 Other chest pain: Secondary | ICD-10-CM

## 2016-06-08 DIAGNOSIS — I2510 Atherosclerotic heart disease of native coronary artery without angina pectoris: Secondary | ICD-10-CM | POA: Diagnosis not present

## 2016-06-08 DIAGNOSIS — E785 Hyperlipidemia, unspecified: Secondary | ICD-10-CM | POA: Diagnosis not present

## 2016-06-08 DIAGNOSIS — Z6828 Body mass index (BMI) 28.0-28.9, adult: Secondary | ICD-10-CM | POA: Diagnosis not present

## 2016-06-08 HISTORY — DX: Other chest pain: R07.89

## 2016-06-10 DIAGNOSIS — D126 Benign neoplasm of colon, unspecified: Secondary | ICD-10-CM | POA: Diagnosis not present

## 2016-06-10 DIAGNOSIS — K5730 Diverticulosis of large intestine without perforation or abscess without bleeding: Secondary | ICD-10-CM | POA: Diagnosis not present

## 2016-06-10 DIAGNOSIS — R1310 Dysphagia, unspecified: Secondary | ICD-10-CM | POA: Diagnosis not present

## 2016-06-15 DIAGNOSIS — K219 Gastro-esophageal reflux disease without esophagitis: Secondary | ICD-10-CM | POA: Diagnosis not present

## 2016-06-15 DIAGNOSIS — K449 Diaphragmatic hernia without obstruction or gangrene: Secondary | ICD-10-CM | POA: Diagnosis not present

## 2016-06-17 DIAGNOSIS — I2510 Atherosclerotic heart disease of native coronary artery without angina pectoris: Secondary | ICD-10-CM | POA: Diagnosis not present

## 2016-06-17 DIAGNOSIS — R0789 Other chest pain: Secondary | ICD-10-CM | POA: Diagnosis not present

## 2016-06-17 DIAGNOSIS — I10 Essential (primary) hypertension: Secondary | ICD-10-CM | POA: Diagnosis not present

## 2016-06-17 DIAGNOSIS — E785 Hyperlipidemia, unspecified: Secondary | ICD-10-CM | POA: Diagnosis not present

## 2016-06-29 DIAGNOSIS — K210 Gastro-esophageal reflux disease with esophagitis: Secondary | ICD-10-CM | POA: Diagnosis not present

## 2016-06-29 DIAGNOSIS — I2510 Atherosclerotic heart disease of native coronary artery without angina pectoris: Secondary | ICD-10-CM | POA: Diagnosis not present

## 2016-06-29 DIAGNOSIS — J45909 Unspecified asthma, uncomplicated: Secondary | ICD-10-CM | POA: Diagnosis not present

## 2016-06-29 DIAGNOSIS — Z7982 Long term (current) use of aspirin: Secondary | ICD-10-CM | POA: Diagnosis not present

## 2016-06-29 DIAGNOSIS — K648 Other hemorrhoids: Secondary | ICD-10-CM | POA: Diagnosis not present

## 2016-06-29 DIAGNOSIS — Z1211 Encounter for screening for malignant neoplasm of colon: Secondary | ICD-10-CM | POA: Diagnosis not present

## 2016-06-29 DIAGNOSIS — D120 Benign neoplasm of cecum: Secondary | ICD-10-CM | POA: Diagnosis not present

## 2016-06-29 DIAGNOSIS — K644 Residual hemorrhoidal skin tags: Secondary | ICD-10-CM | POA: Diagnosis not present

## 2016-06-29 DIAGNOSIS — Z885 Allergy status to narcotic agent status: Secondary | ICD-10-CM | POA: Diagnosis not present

## 2016-06-29 DIAGNOSIS — K449 Diaphragmatic hernia without obstruction or gangrene: Secondary | ICD-10-CM | POA: Diagnosis not present

## 2016-06-29 DIAGNOSIS — Z955 Presence of coronary angioplasty implant and graft: Secondary | ICD-10-CM | POA: Diagnosis not present

## 2016-06-29 DIAGNOSIS — I10 Essential (primary) hypertension: Secondary | ICD-10-CM | POA: Diagnosis not present

## 2016-06-29 DIAGNOSIS — D122 Benign neoplasm of ascending colon: Secondary | ICD-10-CM | POA: Diagnosis not present

## 2016-06-29 DIAGNOSIS — Z79899 Other long term (current) drug therapy: Secondary | ICD-10-CM | POA: Diagnosis not present

## 2016-06-29 DIAGNOSIS — K219 Gastro-esophageal reflux disease without esophagitis: Secondary | ICD-10-CM | POA: Diagnosis not present

## 2016-06-29 DIAGNOSIS — K208 Other esophagitis: Secondary | ICD-10-CM | POA: Diagnosis not present

## 2016-06-29 DIAGNOSIS — K5730 Diverticulosis of large intestine without perforation or abscess without bleeding: Secondary | ICD-10-CM | POA: Diagnosis not present

## 2016-06-29 DIAGNOSIS — R1310 Dysphagia, unspecified: Secondary | ICD-10-CM | POA: Diagnosis not present

## 2016-06-30 DIAGNOSIS — M8589 Other specified disorders of bone density and structure, multiple sites: Secondary | ICD-10-CM | POA: Diagnosis not present

## 2016-06-30 DIAGNOSIS — M85832 Other specified disorders of bone density and structure, left forearm: Secondary | ICD-10-CM | POA: Diagnosis not present

## 2016-07-06 DIAGNOSIS — N390 Urinary tract infection, site not specified: Secondary | ICD-10-CM | POA: Diagnosis not present

## 2016-07-06 DIAGNOSIS — Z6828 Body mass index (BMI) 28.0-28.9, adult: Secondary | ICD-10-CM | POA: Diagnosis not present

## 2016-08-31 DIAGNOSIS — Z0000 Encounter for general adult medical examination without abnormal findings: Secondary | ICD-10-CM | POA: Diagnosis not present

## 2016-08-31 DIAGNOSIS — E785 Hyperlipidemia, unspecified: Secondary | ICD-10-CM | POA: Diagnosis not present

## 2016-08-31 DIAGNOSIS — Z136 Encounter for screening for cardiovascular disorders: Secondary | ICD-10-CM | POA: Diagnosis not present

## 2016-08-31 DIAGNOSIS — Z1389 Encounter for screening for other disorder: Secondary | ICD-10-CM | POA: Diagnosis not present

## 2016-08-31 DIAGNOSIS — Z9181 History of falling: Secondary | ICD-10-CM | POA: Diagnosis not present

## 2016-08-31 DIAGNOSIS — Z23 Encounter for immunization: Secondary | ICD-10-CM | POA: Diagnosis not present

## 2016-12-21 DIAGNOSIS — J45909 Unspecified asthma, uncomplicated: Secondary | ICD-10-CM | POA: Diagnosis not present

## 2016-12-21 DIAGNOSIS — J3489 Other specified disorders of nose and nasal sinuses: Secondary | ICD-10-CM | POA: Diagnosis not present

## 2016-12-21 DIAGNOSIS — I2510 Atherosclerotic heart disease of native coronary artery without angina pectoris: Secondary | ICD-10-CM | POA: Diagnosis not present

## 2016-12-21 DIAGNOSIS — M8589 Other specified disorders of bone density and structure, multiple sites: Secondary | ICD-10-CM | POA: Diagnosis not present

## 2016-12-21 DIAGNOSIS — I10 Essential (primary) hypertension: Secondary | ICD-10-CM | POA: Diagnosis not present

## 2016-12-21 DIAGNOSIS — E785 Hyperlipidemia, unspecified: Secondary | ICD-10-CM | POA: Diagnosis not present

## 2016-12-21 DIAGNOSIS — E559 Vitamin D deficiency, unspecified: Secondary | ICD-10-CM | POA: Diagnosis not present

## 2016-12-21 DIAGNOSIS — Z79899 Other long term (current) drug therapy: Secondary | ICD-10-CM | POA: Diagnosis not present

## 2016-12-24 DIAGNOSIS — J208 Acute bronchitis due to other specified organisms: Secondary | ICD-10-CM | POA: Diagnosis not present

## 2016-12-24 DIAGNOSIS — Z6829 Body mass index (BMI) 29.0-29.9, adult: Secondary | ICD-10-CM | POA: Diagnosis not present

## 2017-01-12 ENCOUNTER — Encounter: Payer: Self-pay | Admitting: Cardiology

## 2017-01-12 ENCOUNTER — Ambulatory Visit (INDEPENDENT_AMBULATORY_CARE_PROVIDER_SITE_OTHER): Payer: Medicare Other | Admitting: Cardiology

## 2017-01-12 VITALS — BP 122/80 | HR 60 | Resp 10 | Ht 68.0 in | Wt 187.1 lb

## 2017-01-12 DIAGNOSIS — I2510 Atherosclerotic heart disease of native coronary artery without angina pectoris: Secondary | ICD-10-CM | POA: Diagnosis not present

## 2017-01-12 DIAGNOSIS — I10 Essential (primary) hypertension: Secondary | ICD-10-CM | POA: Diagnosis not present

## 2017-01-12 DIAGNOSIS — R0789 Other chest pain: Secondary | ICD-10-CM

## 2017-01-12 DIAGNOSIS — E785 Hyperlipidemia, unspecified: Secondary | ICD-10-CM

## 2017-01-12 MED ORDER — VALSARTAN 160 MG PO TABS: 160.0000 mg | ORAL_TABLET | Freq: Every day | ORAL | 3 refills | Status: DC

## 2017-01-12 NOTE — Patient Instructions (Signed)
Medication Instructions:  Your physician recommends that you continue on your current medications as directed. Please refer to the Current Medication list given to you today.  Labwork: None    Testing/Procedures: None   Follow-Up: Your physician wants you to follow-up in: 6 months. You will receive a reminder letter in the mail two months in advance. If you don't receive a letter, please call our office to schedule the follow-up appointment.  Any Other Special Instructions Will Be Listed Below (If Applicable).  Please note that any paperwork needing to be filled out by the provider will need to be addressed at the front desk prior to seeing the provider. Please note that any paperwork FMLA, Disability or other documents regarding health condition is subject to a $25.00 charge that must be received prior to completion of paperwork in the form of a money order or check.    If you need a refill on your cardiac medications before your next appointment, please call your pharmacy.  
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
## 2017-01-20 DIAGNOSIS — H5212 Myopia, left eye: Secondary | ICD-10-CM | POA: Diagnosis not present

## 2017-01-20 DIAGNOSIS — H26493 Other secondary cataract, bilateral: Secondary | ICD-10-CM | POA: Diagnosis not present

## 2017-03-02 DIAGNOSIS — Z23 Encounter for immunization: Secondary | ICD-10-CM | POA: Diagnosis not present

## 2017-05-27 ENCOUNTER — Ambulatory Visit: Payer: Medicare Other | Admitting: Cardiology

## 2017-06-06 ENCOUNTER — Telehealth: Payer: Self-pay

## 2017-06-06 NOTE — Telephone Encounter (Signed)
Patient is concerned regarding her medication valsartan as she states it has been recalled many times since she has started it; will consult with Dr. Agustin Cree.

## 2017-06-07 ENCOUNTER — Other Ambulatory Visit: Payer: Self-pay

## 2017-06-07 ENCOUNTER — Telehealth: Payer: Self-pay | Admitting: Cardiology

## 2017-06-07 MED ORDER — LOSARTAN POTASSIUM 50 MG PO TABS: 50.0000 mg | ORAL_TABLET | Freq: Every day | ORAL | 0 refills | Status: DC

## 2017-06-07 NOTE — Telephone Encounter (Signed)
Per Dr. Agustin Cree valsartan discontinued and cozaar initiated per patient preference. Estill Bamberg is in the process of filling prior-auth form out.

## 2017-06-07 NOTE — Telephone Encounter (Signed)
Patient states you needed prior authorization number which is [id number] for her Praluent. She also needs to speak to you regarding changing her BP medicine

## 2017-06-08 NOTE — Telephone Encounter (Signed)
Contacted patient to find out what statin medicatons she has taken in the past. Will fax in Prior auth

## 2017-06-13 ENCOUNTER — Telehealth: Payer: Self-pay | Admitting: Cardiology

## 2017-06-13 NOTE — Telephone Encounter (Signed)
Spoke with patient stated that insurance contacted her and advised her she had been denied. She will contact them again tomorrow

## 2017-06-13 NOTE — Telephone Encounter (Signed)
About a prior auth for a medication she needs

## 2017-06-14 ENCOUNTER — Other Ambulatory Visit: Payer: Self-pay | Admitting: *Deleted

## 2017-06-14 DIAGNOSIS — J019 Acute sinusitis, unspecified: Secondary | ICD-10-CM | POA: Diagnosis not present

## 2017-06-14 DIAGNOSIS — J208 Acute bronchitis due to other specified organisms: Secondary | ICD-10-CM | POA: Diagnosis not present

## 2017-06-14 DIAGNOSIS — Z6830 Body mass index (BMI) 30.0-30.9, adult: Secondary | ICD-10-CM | POA: Diagnosis not present

## 2017-06-14 MED ORDER — METOPROLOL TARTRATE 25 MG PO TABS: 25.0000 mg | ORAL_TABLET | Freq: Two times a day (BID) | ORAL | 1 refills | Status: DC

## 2017-06-14 MED ORDER — LOSARTAN POTASSIUM 50 MG PO TABS: 50.0000 mg | ORAL_TABLET | Freq: Every day | ORAL | 1 refills | Status: DC

## 2017-06-28 DIAGNOSIS — E785 Hyperlipidemia, unspecified: Secondary | ICD-10-CM | POA: Diagnosis not present

## 2017-06-28 DIAGNOSIS — M8589 Other specified disorders of bone density and structure, multiple sites: Secondary | ICD-10-CM | POA: Diagnosis not present

## 2017-06-28 DIAGNOSIS — I2510 Atherosclerotic heart disease of native coronary artery without angina pectoris: Secondary | ICD-10-CM | POA: Diagnosis not present

## 2017-06-28 DIAGNOSIS — I10 Essential (primary) hypertension: Secondary | ICD-10-CM | POA: Diagnosis not present

## 2017-06-28 DIAGNOSIS — Z1231 Encounter for screening mammogram for malignant neoplasm of breast: Secondary | ICD-10-CM | POA: Diagnosis not present

## 2017-06-28 DIAGNOSIS — Z6829 Body mass index (BMI) 29.0-29.9, adult: Secondary | ICD-10-CM | POA: Diagnosis not present

## 2017-06-28 DIAGNOSIS — J45909 Unspecified asthma, uncomplicated: Secondary | ICD-10-CM | POA: Diagnosis not present

## 2017-06-28 DIAGNOSIS — E559 Vitamin D deficiency, unspecified: Secondary | ICD-10-CM | POA: Diagnosis not present

## 2017-06-28 DIAGNOSIS — Z79899 Other long term (current) drug therapy: Secondary | ICD-10-CM | POA: Diagnosis not present

## 2017-07-06 DIAGNOSIS — J208 Acute bronchitis due to other specified organisms: Secondary | ICD-10-CM | POA: Diagnosis not present

## 2017-07-07 DIAGNOSIS — M7062 Trochanteric bursitis, left hip: Secondary | ICD-10-CM | POA: Insufficient documentation

## 2017-07-07 DIAGNOSIS — M25552 Pain in left hip: Secondary | ICD-10-CM | POA: Insufficient documentation

## 2017-07-07 HISTORY — DX: Pain in left hip: M25.552

## 2017-07-07 HISTORY — DX: Trochanteric bursitis, left hip: M70.62

## 2017-07-14 DIAGNOSIS — M25552 Pain in left hip: Secondary | ICD-10-CM | POA: Diagnosis not present

## 2017-07-14 DIAGNOSIS — M6281 Muscle weakness (generalized): Secondary | ICD-10-CM | POA: Diagnosis not present

## 2017-07-14 DIAGNOSIS — R2689 Other abnormalities of gait and mobility: Secondary | ICD-10-CM | POA: Diagnosis not present

## 2017-07-14 DIAGNOSIS — M7632 Iliotibial band syndrome, left leg: Secondary | ICD-10-CM | POA: Diagnosis not present

## 2017-07-15 DIAGNOSIS — Z1231 Encounter for screening mammogram for malignant neoplasm of breast: Secondary | ICD-10-CM | POA: Diagnosis not present

## 2017-07-19 DIAGNOSIS — M7632 Iliotibial band syndrome, left leg: Secondary | ICD-10-CM | POA: Diagnosis not present

## 2017-07-19 DIAGNOSIS — M6281 Muscle weakness (generalized): Secondary | ICD-10-CM | POA: Diagnosis not present

## 2017-07-19 DIAGNOSIS — R2689 Other abnormalities of gait and mobility: Secondary | ICD-10-CM | POA: Diagnosis not present

## 2017-07-19 DIAGNOSIS — M25552 Pain in left hip: Secondary | ICD-10-CM | POA: Diagnosis not present

## 2017-07-22 DIAGNOSIS — R2689 Other abnormalities of gait and mobility: Secondary | ICD-10-CM | POA: Diagnosis not present

## 2017-07-22 DIAGNOSIS — M7632 Iliotibial band syndrome, left leg: Secondary | ICD-10-CM | POA: Diagnosis not present

## 2017-07-22 DIAGNOSIS — M25552 Pain in left hip: Secondary | ICD-10-CM | POA: Diagnosis not present

## 2017-07-22 DIAGNOSIS — M6281 Muscle weakness (generalized): Secondary | ICD-10-CM | POA: Diagnosis not present

## 2017-07-26 DIAGNOSIS — M7632 Iliotibial band syndrome, left leg: Secondary | ICD-10-CM | POA: Diagnosis not present

## 2017-07-26 DIAGNOSIS — M25552 Pain in left hip: Secondary | ICD-10-CM | POA: Diagnosis not present

## 2017-07-26 DIAGNOSIS — M6281 Muscle weakness (generalized): Secondary | ICD-10-CM | POA: Diagnosis not present

## 2017-07-26 DIAGNOSIS — R2689 Other abnormalities of gait and mobility: Secondary | ICD-10-CM | POA: Diagnosis not present

## 2017-07-27 ENCOUNTER — Ambulatory Visit (INDEPENDENT_AMBULATORY_CARE_PROVIDER_SITE_OTHER): Payer: Medicare Other | Admitting: Cardiology

## 2017-07-27 ENCOUNTER — Encounter (INDEPENDENT_AMBULATORY_CARE_PROVIDER_SITE_OTHER): Payer: Self-pay

## 2017-07-27 ENCOUNTER — Encounter: Payer: Self-pay | Admitting: Cardiology

## 2017-07-27 VITALS — BP 120/68 | HR 58 | Ht 68.0 in | Wt 186.0 lb

## 2017-07-27 DIAGNOSIS — I10 Essential (primary) hypertension: Secondary | ICD-10-CM

## 2017-07-27 DIAGNOSIS — E785 Hyperlipidemia, unspecified: Secondary | ICD-10-CM

## 2017-07-27 DIAGNOSIS — I2510 Atherosclerotic heart disease of native coronary artery without angina pectoris: Secondary | ICD-10-CM | POA: Diagnosis not present
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
## 2017-07-27 NOTE — Patient Instructions (Addendum)
Medication Instructions:  Your physician recommends that you continue on your current medications as directed. Please refer to the Current Medication list given to you today.  Labwork: None ordered  Testing/Procedures: None nordered  Follow-Up: Your physician recommends that you schedule a follow-up appointment in: 6 months with Dr. Agustin Cree in [place]   Any Other Special Instructions Will Be Listed Below (If Applicable).     If you need a refill on your cardiac medications before your next appointment, please call your pharmacy.

## 2017-09-13 DIAGNOSIS — Z1231 Encounter for screening mammogram for malignant neoplasm of breast: Secondary | ICD-10-CM | POA: Diagnosis not present

## 2017-09-13 DIAGNOSIS — Z0000 Encounter for general adult medical examination without abnormal findings: Secondary | ICD-10-CM | POA: Diagnosis not present

## 2017-09-13 DIAGNOSIS — E785 Hyperlipidemia, unspecified: Secondary | ICD-10-CM | POA: Diagnosis not present

## 2017-09-13 DIAGNOSIS — N959 Unspecified menopausal and perimenopausal disorder: Secondary | ICD-10-CM | POA: Diagnosis not present

## 2017-09-13 DIAGNOSIS — Z1331 Encounter for screening for depression: Secondary | ICD-10-CM | POA: Diagnosis not present

## 2017-09-13 DIAGNOSIS — Z136 Encounter for screening for cardiovascular disorders: Secondary | ICD-10-CM | POA: Diagnosis not present

## 2017-09-13 DIAGNOSIS — Z9181 History of falling: Secondary | ICD-10-CM | POA: Diagnosis not present

## 2017-12-22 ENCOUNTER — Other Ambulatory Visit: Payer: Self-pay | Admitting: Cardiology

## 2017-12-27 DIAGNOSIS — E559 Vitamin D deficiency, unspecified: Secondary | ICD-10-CM | POA: Diagnosis not present

## 2017-12-27 DIAGNOSIS — I2510 Atherosclerotic heart disease of native coronary artery without angina pectoris: Secondary | ICD-10-CM | POA: Diagnosis not present

## 2017-12-27 DIAGNOSIS — J45909 Unspecified asthma, uncomplicated: Secondary | ICD-10-CM | POA: Diagnosis not present

## 2017-12-27 DIAGNOSIS — E785 Hyperlipidemia, unspecified: Secondary | ICD-10-CM | POA: Diagnosis not present

## 2017-12-27 DIAGNOSIS — I10 Essential (primary) hypertension: Secondary | ICD-10-CM | POA: Diagnosis not present

## 2017-12-27 DIAGNOSIS — Z79899 Other long term (current) drug therapy: Secondary | ICD-10-CM | POA: Diagnosis not present

## 2017-12-27 DIAGNOSIS — Z1339 Encounter for screening examination for other mental health and behavioral disorders: Secondary | ICD-10-CM | POA: Diagnosis not present

## 2018-01-27 DIAGNOSIS — H5340 Unspecified visual field defects: Secondary | ICD-10-CM | POA: Diagnosis not present

## 2018-01-27 DIAGNOSIS — H26493 Other secondary cataract, bilateral: Secondary | ICD-10-CM | POA: Diagnosis not present

## 2018-02-08 DIAGNOSIS — H5340 Unspecified visual field defects: Secondary | ICD-10-CM | POA: Diagnosis not present

## 2018-02-14 DIAGNOSIS — Z23 Encounter for immunization: Secondary | ICD-10-CM | POA: Diagnosis not present

## 2018-02-17 DIAGNOSIS — H26492 Other secondary cataract, left eye: Secondary | ICD-10-CM | POA: Diagnosis not present

## 2018-03-01 DIAGNOSIS — H5340 Unspecified visual field defects: Secondary | ICD-10-CM | POA: Diagnosis not present

## 2018-03-07 ENCOUNTER — Telehealth: Payer: Self-pay | Admitting: Cardiology

## 2018-03-07 NOTE — Telephone Encounter (Signed)
Shanikwa will check with [facility] to see if the medication is recalled there as well. If it is not she will call our office and I will send the medication there.

## 2018-03-07 NOTE — Telephone Encounter (Signed)
Patient would like to speak to someone about her losartan 50mg  tablet

## 2018-04-07 ENCOUNTER — Ambulatory Visit (INDEPENDENT_AMBULATORY_CARE_PROVIDER_SITE_OTHER): Payer: Medicare Other | Admitting: Cardiology

## 2018-04-07 ENCOUNTER — Encounter: Payer: Self-pay | Admitting: Emergency Medicine

## 2018-04-07 VITALS — BP 132/80 | HR 78 | Ht 68.0 in | Wt 185.0 lb

## 2018-04-07 DIAGNOSIS — E785 Hyperlipidemia, unspecified: Secondary | ICD-10-CM | POA: Diagnosis not present

## 2018-04-07 DIAGNOSIS — I10 Essential (primary) hypertension: Secondary | ICD-10-CM | POA: Diagnosis not present

## 2018-04-07 DIAGNOSIS — I2510 Atherosclerotic heart disease of native coronary artery without angina pectoris: Secondary | ICD-10-CM
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
## 2018-04-07 NOTE — Patient Instructions (Signed)
Medication Instructions:  Your physician recommends that you continue on your current medications as directed. Please refer to the Current Medication list given to you today.  If you need a refill on your cardiac medications before your next appointment, please call your pharmacy.   Lab work: None.  If you have labs (blood work) drawn today and your tests are completely normal, you will receive your results only by: [name] MyChart Message (if you have MyChart) OR . A paper copy in the mail If you have any lab test that is abnormal or we need to change your treatment, we will call you to review the results.  Testing/Procedures: Your physician has requested that you have an echocardiogram. Echocardiography is a painless test that uses sound waves to create images of your heart. It provides your doctor with information about the size and shape of your heart and how well your heart's chambers and valves are working. This procedure takes approximately one hour. There are no restrictions for this procedure.    Follow-Up: At [facility], you and your health needs are our priority.  As part of our continuing mission to provide you with exceptional heart care, we have created designated Provider Care Teams.  These Care Teams include your primary Cardiologist (physician) and Advanced Practice Providers (APPs -  Physician Assistants and Nurse Practitioners) who all work together to provide you with the care you need, when you need it. You will need a follow up appointment in 6 months.  Please call our office 2 months in advance to schedule this appointment.  You may see No primary care provider on file. or another member of our [employer] Provider Team in [place]: Shirlee More, MD . Jyl Heinz, MD  Any Other Special Instructions Will Be Listed Below (If Applicable).

## 2018-04-25 ENCOUNTER — Other Ambulatory Visit: Payer: Self-pay | Admitting: Cardiology

## 2018-05-30 ENCOUNTER — Telehealth: Payer: Self-pay | Admitting: Cardiology

## 2018-05-30 ENCOUNTER — Ambulatory Visit (INDEPENDENT_AMBULATORY_CARE_PROVIDER_SITE_OTHER): Payer: Medicare Other

## 2018-05-30 DIAGNOSIS — I2510 Atherosclerotic heart disease of native coronary artery without angina pectoris: Secondary | ICD-10-CM | POA: Diagnosis not present

## 2018-05-30 DIAGNOSIS — I10 Essential (primary) hypertension: Secondary | ICD-10-CM | POA: Diagnosis not present

## 2018-05-30 NOTE — Telephone Encounter (Signed)
Wants to know where to dispose her used needles

## 2018-05-30 NOTE — Telephone Encounter (Signed)
She can place in a hard plastic bottle (like laundry detergent bottle), tape up top, and place in regular trash.

## 2018-05-30 NOTE — Telephone Encounter (Signed)
Left message per dpr informing patient on the way listed below to dispose of sharps. Advised her to call back with any questions

## 2018-05-30 NOTE — Progress Notes (Signed)
Complete echocardiogram has been performed.  Jimmy Kiyani Jernigan RDCS, RVT 

## 2018-06-02 DIAGNOSIS — H4900 Third [oculomotor] nerve palsy, unspecified eye: Secondary | ICD-10-CM | POA: Diagnosis not present

## 2018-06-02 DIAGNOSIS — H5340 Unspecified visual field defects: Secondary | ICD-10-CM | POA: Diagnosis not present

## 2018-06-12 ENCOUNTER — Other Ambulatory Visit: Payer: Self-pay | Admitting: Ophthalmology

## 2018-06-12 DIAGNOSIS — H4900 Third [oculomotor] nerve palsy, unspecified eye: Secondary | ICD-10-CM

## 2018-06-13 ENCOUNTER — Other Ambulatory Visit: Payer: Self-pay | Admitting: Cardiology

## 2018-06-18 ENCOUNTER — Ambulatory Visit
Admission: RE | Admit: 2018-06-18 | Discharge: 2018-06-18 | Disposition: A | Discharge status: Home / Self Care | Payer: Medicare Other | Source: Ambulatory Visit | Attending: Ophthalmology | Admitting: Ophthalmology

## 2018-06-18 DIAGNOSIS — H532 Diplopia: Secondary | ICD-10-CM | POA: Diagnosis not present

## 2018-06-18 DIAGNOSIS — H4900 Third [oculomotor] nerve palsy, unspecified eye: Secondary | ICD-10-CM

## 2018-06-18 DIAGNOSIS — G588 Other specified mononeuropathies: Secondary | ICD-10-CM | POA: Diagnosis not present

## 2018-06-18 MED ORDER — GADOBENATE DIMEGLUMINE 529 MG/ML IV SOLN
17.0000 mL | Freq: Once | INTRAVENOUS | Status: AC | PRN
  Administered 2018-06-18: 17 mL via INTRAVENOUS

## 2018-06-28 ENCOUNTER — Other Ambulatory Visit: Payer: Self-pay | Admitting: Emergency Medicine

## 2018-06-28 MED ORDER — LOSARTAN POTASSIUM 50 MG PO TABS: 50.0000 mg | ORAL_TABLET | Freq: Every day | ORAL | 1 refills | Status: DC

## 2018-06-28 MED ORDER — METOPROLOL TARTRATE 25 MG PO TABS: 25.0000 mg | ORAL_TABLET | Freq: Two times a day (BID) | ORAL | 1 refills | Status: DC

## 2018-06-30 DIAGNOSIS — Z79899 Other long term (current) drug therapy: Secondary | ICD-10-CM | POA: Diagnosis not present

## 2018-06-30 DIAGNOSIS — J45909 Unspecified asthma, uncomplicated: Secondary | ICD-10-CM | POA: Diagnosis not present

## 2018-06-30 DIAGNOSIS — I10 Essential (primary) hypertension: Secondary | ICD-10-CM | POA: Diagnosis not present

## 2018-06-30 DIAGNOSIS — E785 Hyperlipidemia, unspecified: Secondary | ICD-10-CM | POA: Diagnosis not present

## 2018-06-30 DIAGNOSIS — I2510 Atherosclerotic heart disease of native coronary artery without angina pectoris: Secondary | ICD-10-CM | POA: Diagnosis not present

## 2018-06-30 DIAGNOSIS — E559 Vitamin D deficiency, unspecified: Secondary | ICD-10-CM | POA: Diagnosis not present

## 2018-07-06 DIAGNOSIS — H4900 Third [oculomotor] nerve palsy, unspecified eye: Secondary | ICD-10-CM | POA: Diagnosis not present

## 2018-07-31 DIAGNOSIS — N390 Urinary tract infection, site not specified: Secondary | ICD-10-CM | POA: Diagnosis not present

## 2018-09-06 ENCOUNTER — Telehealth: Payer: Self-pay | Admitting: Cardiology

## 2018-09-06 NOTE — Telephone Encounter (Signed)
Virtual Visit Pre-Appointment Phone Call  "(Name), I am calling you today to discuss your upcoming appointment. We are currently trying to limit exposure to the virus that causes COVID-19 by seeing patients at home rather than in the office."  1. "What is the BEST phone number to call the day of the visit?" - include this in appointment notes  2. Do you have or have access to (through a family member/friend) a smartphone with video capability that we can use for your visit?" a. If yes - list this number in appt notes as cell (if different from BEST phone #) and list the appointment type as a VIDEO visit in appointment notes b. If no - list the appointment type as a PHONE visit in appointment notes  3. Confirm consent - "In the setting of the current Covid19 crisis, you are scheduled for a (phone or video) visit with your provider on (date) at (time).  Just as we do with many in-office visits, in order for you to participate in this visit, we must obtain consent.  If you'd like, I can send this to your mychart (if signed up) or email for you to review.  Otherwise, I can obtain your verbal consent now.  All virtual visits are billed to your insurance company just like a normal visit would be.  By agreeing to a virtual visit, we'd like you to understand that the technology does not allow for your provider to perform an examination, and thus may limit your provider's ability to fully assess your condition. If your provider identifies any concerns that need to be evaluated in person, we will make arrangements to do so.  Finally, though the technology is pretty good, we cannot assure that it will always work on either your or our end, and in the setting of a video visit, we may have to convert it to a phone-only visit.  In either situation, we cannot ensure that we have a secure connection.  Are you willing to proceed?" STAFF: Did the patient verbally acknowledge consent to telehealth visit? Document  YES/NO here: YES  4. Advise patient to be prepared - "Two hours prior to your appointment, go ahead and check your blood pressure, pulse, oxygen saturation, and your weight (if you have the equipment to check those) and write them all down. When your visit starts, your provider will ask you for this information. If you have an Apple Watch or Kardia device, please plan to have heart rate information ready on the day of your appointment. Please have a pen and paper handy nearby the day of the visit as well."  5. Give patient instructions for MyChart download to smartphone OR Doximity/Doxy.me as below if video visit (depending on what platform provider is using)  6. Inform patient they will receive a phone call 15 minutes prior to their appointment time (may be from unknown caller ID) so they should be prepared to answer    TELEPHONE CALL NOTE  Emily Ayers has been deemed a candidate for a follow-up tele-health visit to limit community exposure during the Covid-19 pandemic. I spoke with the patient via phone to ensure availability of phone/video source, confirm preferred email & phone number, and discuss instructions and expectations.  I reminded Emily Ayers to be prepared with any vital sign and/or heart rhythm information that could potentially be obtained via home monitoring, at the time of her visit. I reminded Emily Ayers to expect a phone call prior to  her visit.  Isaiah Blakes 09/06/2018 9:36 AM   INSTRUCTIONS FOR DOWNLOADING THE MYCHART APP TO SMARTPHONE  - The patient must first make sure to have activated MyChart and know their login information - If Apple, go to [employer] and type in MyChart in the search bar and download the app. If Android, ask patient to go to [employer] and type in [place] in the search bar and download the app. The app is free but as with any other app downloads, their phone may require them to verify saved payment information or  Apple/Android password.  - The patient will need to then log into the app with their MyChart username and password, and select [facility] as their healthcare provider to link the account. When it is time for your visit, go to the MyChart app, find appointments, and click Begin Video Visit. Be sure to Select Allow for your device to access the Microphone and Camera for your visit. You will then be connected, and your provider will be with you shortly.  **If they have any issues connecting, or need assistance please contact MyChart service desk [phone])**  **If using a computer, in order to ensure the best quality for their visit they will need to use either of the following Internet Browsers: [employer], or Google Chrome**  IF USING DOXIMITY or DOXY.ME - The patient will receive a link just prior to their visit by text.     FULL LENGTH CONSENT FOR TELE-HEALTH VISIT   I hereby voluntarily request, consent and authorize [facility] and its employed or contracted physicians, physician assistants, nurse practitioners or other licensed health care professionals (the Practitioner), to provide me with telemedicine health care services (the Services") as deemed necessary by the treating Practitioner. I acknowledge and consent to receive the Services by the Practitioner via telemedicine. I understand that the telemedicine visit will involve communicating with the Practitioner through live audiovisual communication technology and the disclosure of certain medical information by electronic transmission. I acknowledge that I have been given the opportunity to request an in-person assessment or other available alternative prior to the telemedicine visit and am voluntarily participating in the telemedicine visit.  I understand that I have the right to withhold or withdraw my consent to the use of telemedicine in the course of my care at any time, without affecting my right to future care  or treatment, and that the Practitioner or I may terminate the telemedicine visit at any time. I understand that I have the right to inspect all information obtained and/or recorded in the course of the telemedicine visit and may receive copies of available information for a reasonable fee.  I understand that some of the potential risks of receiving the Services via telemedicine include:   Delay or interruption in medical evaluation due to technological equipment failure or disruption;  Information transmitted may not be sufficient (e.g. poor resolution of images) to allow for appropriate medical decision making by the Practitioner; and/or   In rare instances, security protocols could fail, causing a breach of personal health information.  Furthermore, I acknowledge that it is my responsibility to provide information about my medical history, conditions and care that is complete and accurate to the best of my ability. I acknowledge that Practitioner's advice, recommendations, and/or decision may be based on factors not within their control, such as incomplete or inaccurate data provided by me or distortions of diagnostic images or specimens that may result from electronic transmissions. I  understand that the practice of medicine is not an exact science and that Practitioner makes no warranties or guarantees regarding treatment outcomes. I acknowledge that I will receive a copy of this consent concurrently upon execution via email to the email address I last provided but may also request a printed copy by calling the office of [facility].    I understand that my insurance will be billed for this visit.   I have read or had this consent read to me.  I understand the contents of this consent, which adequately explains the benefits and risks of the Services being provided via telemedicine.   I have been provided ample opportunity to ask questions regarding this consent and the Services and have had  my questions answered to my satisfaction.  I give my informed consent for the services to be provided through the use of telemedicine in my medical care  By participating in this telemedicine visit I agree to the above.     Cardiac Questionnaire:    Since your last visit or hospitalization:    1. Have you been having new or worsening chest pain? no   2. Have you been having new or worsening shortness of breath? no 3. Have you been having new or worsening leg swelling, wt gain, or increase in abdominal girth (pants fitting more tightly)? no   4. Have you had any passing out spells? no    *A YES to any of these questions would result in the appointment being kept. *If all the answers to these questions are NO, we should indicate that given the current situation regarding the worldwide coronarvirus pandemic, at the recommendation of the CDC, we are looking to limit gatherings in our waiting area, and thus will reschedule their appointment beyond four weeks from today.   _____________   EYCXK-[id number] Pre-Screening Questions:   Do you currently have a fever? no (yes = cancel and refer to pcp for e-visit)  Have you recently travelled on a cruise, internationally, or to [place], [place], [place], [place], [place], or [place], [place]) ? no (yes = cancel, stay home, monitor symptoms, and contact pcp or initiate e-visit if symptoms develop)  Have you been in contact with someone that is currently pending confirmation of Covid19 testing or has been confirmed to have the [place] virus?  no (yes = cancel, stay home, away from tested individual, monitor symptoms, and contact pcp or initiate e-visit if symptoms develop)  Are you currently experiencing fatigue or cough? no (yes = pt should be prepared to have a mask placed at the time of their visit).

## 2018-09-14 ENCOUNTER — Telehealth: Payer: Self-pay | Admitting: *Deleted

## 2018-09-14 MED ORDER — ALIROCUMAB 75 MG/ML ~~LOC~~ SOAJ: 75.0000 mg | SUBCUTANEOUS | 1 refills | Status: DC

## 2018-09-14 NOTE — Telephone Encounter (Signed)
Rx refill sent to pharmacy.  *STAT* If patient is at the pharmacy, call can be transferred to refill team.   1. Which medications need to be refilled? (please list name of each medication and dose if known) Praluent 75, shot  2. Which pharmacy/location (including street and city if local pharmacy) is medication to be sent to? [facility]  3. Do they need a 30 day or 90 day supply? [place]

## 2018-09-19 DIAGNOSIS — Z9181 History of falling: Secondary | ICD-10-CM | POA: Diagnosis not present

## 2018-09-19 DIAGNOSIS — E785 Hyperlipidemia, unspecified: Secondary | ICD-10-CM | POA: Diagnosis not present

## 2018-09-19 DIAGNOSIS — Z1231 Encounter for screening mammogram for malignant neoplasm of breast: Secondary | ICD-10-CM | POA: Diagnosis not present

## 2018-09-19 DIAGNOSIS — Z0000 Encounter for general adult medical examination without abnormal findings: Secondary | ICD-10-CM | POA: Diagnosis not present

## 2018-09-19 DIAGNOSIS — Z136 Encounter for screening for cardiovascular disorders: Secondary | ICD-10-CM | POA: Diagnosis not present

## 2018-09-19 DIAGNOSIS — Z1331 Encounter for screening for depression: Secondary | ICD-10-CM | POA: Diagnosis not present

## 2018-09-19 DIAGNOSIS — N959 Unspecified menopausal and perimenopausal disorder: Secondary | ICD-10-CM | POA: Diagnosis not present

## 2018-09-20 ENCOUNTER — Encounter: Payer: Self-pay | Admitting: Emergency Medicine

## 2018-09-20 ENCOUNTER — Encounter: Payer: Self-pay | Admitting: Cardiology

## 2018-09-20 ENCOUNTER — Telehealth (INDEPENDENT_AMBULATORY_CARE_PROVIDER_SITE_OTHER): Payer: Medicare Other | Admitting: Cardiology

## 2018-09-20 ENCOUNTER — Other Ambulatory Visit: Payer: Self-pay

## 2018-09-20 VITALS — BP 139/92 | HR 55 | Wt 183.5 lb

## 2018-09-20 DIAGNOSIS — E785 Hyperlipidemia, unspecified: Secondary | ICD-10-CM

## 2018-09-20 DIAGNOSIS — I10 Essential (primary) hypertension: Secondary | ICD-10-CM

## 2018-09-20 DIAGNOSIS — I2510 Atherosclerotic heart disease of native coronary artery without angina pectoris: Secondary | ICD-10-CM

## 2018-09-20 DIAGNOSIS — R0789 Other chest pain: Secondary | ICD-10-CM
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
## 2018-09-20 NOTE — Patient Instructions (Signed)
Medication Instructions:  Your physician recommends that you continue on your current medications as directed. Please refer to the Current Medication list given to you today.  If you need a refill on your cardiac medications before your next appointment, please call your pharmacy.   Lab work: None.  If you have labs (blood work) drawn today and your tests are completely normal, you will receive your results only by: . MyChart Message (if you have MyChart) OR . A paper copy in the mail If you have any lab test that is abnormal or we need to change your treatment, we will call you to review the results.  Testing/Procedures: None.   Follow-Up: At [facility], you and your health needs are our priority.  As part of our continuing mission to provide you with exceptional heart care, we have created designated Provider Care Teams.  These Care Teams include your primary Cardiologist (physician) and Advanced Practice Providers (APPs -  Physician Assistants and Nurse Practitioners) who all work together to provide you with the care you need, when you need it. You will need a follow up appointment in 4 months.  Please call our office 2 months in advance to schedule this appointment.  You may see No primary care provider on file. or another member of our [facility] Provider Team in [place]: Brian Munley, MD . Rajan Revankar, MD  Any Other Special Instructions Will Be Listed Below (If Applicable).     

## 2018-10-04 ENCOUNTER — Other Ambulatory Visit: Payer: Self-pay | Admitting: Cardiology

## 2018-10-04 MED ORDER — ALIROCUMAB 75 MG/ML ~~LOC~~ SOAJ: 75.0000 mg | SUBCUTANEOUS | 1 refills | Status: DC

## 2018-10-04 NOTE — Telephone Encounter (Signed)
*  STAT* PHARMACY CHANGE  1. Which medications need to be refilled? (please list name of each medication and dose if known) Alirocumab (Petroleum) 75 MG/ML SOAJ    2. Which pharmacy/location (including street and city if local pharmacy) is medication to be sent to? [facility] 3. Do they need a 30 day or 90 day supply? 90 day

## 2018-12-19 ENCOUNTER — Other Ambulatory Visit: Payer: Self-pay | Admitting: Cardiology

## 2019-01-04 DIAGNOSIS — H4900 Third [oculomotor] nerve palsy, unspecified eye: Secondary | ICD-10-CM | POA: Diagnosis not present

## 2019-01-05 DIAGNOSIS — Z79899 Other long term (current) drug therapy: Secondary | ICD-10-CM | POA: Diagnosis not present

## 2019-01-05 DIAGNOSIS — Z9861 Coronary angioplasty status: Secondary | ICD-10-CM | POA: Diagnosis not present

## 2019-01-05 DIAGNOSIS — I10 Essential (primary) hypertension: Secondary | ICD-10-CM | POA: Diagnosis not present

## 2019-01-05 DIAGNOSIS — J45909 Unspecified asthma, uncomplicated: Secondary | ICD-10-CM | POA: Diagnosis not present

## 2019-01-05 DIAGNOSIS — E785 Hyperlipidemia, unspecified: Secondary | ICD-10-CM | POA: Diagnosis not present

## 2019-01-05 DIAGNOSIS — Z139 Encounter for screening, unspecified: Secondary | ICD-10-CM | POA: Diagnosis not present

## 2019-01-05 DIAGNOSIS — I2510 Atherosclerotic heart disease of native coronary artery without angina pectoris: Secondary | ICD-10-CM | POA: Diagnosis not present

## 2019-01-30 ENCOUNTER — Other Ambulatory Visit: Payer: Self-pay

## 2019-01-30 ENCOUNTER — Ambulatory Visit (INDEPENDENT_AMBULATORY_CARE_PROVIDER_SITE_OTHER): Payer: Medicare Other | Admitting: Cardiology

## 2019-01-30 ENCOUNTER — Encounter: Payer: Self-pay | Admitting: Cardiology

## 2019-01-30 VITALS — BP 140/90 | HR 54 | Ht 68.0 in | Wt 187.0 lb

## 2019-01-30 DIAGNOSIS — I10 Essential (primary) hypertension: Secondary | ICD-10-CM | POA: Diagnosis not present

## 2019-01-30 DIAGNOSIS — R0789 Other chest pain: Secondary | ICD-10-CM | POA: Diagnosis not present

## 2019-01-30 DIAGNOSIS — E785 Hyperlipidemia, unspecified: Secondary | ICD-10-CM | POA: Diagnosis not present

## 2019-01-30 DIAGNOSIS — I2510 Atherosclerotic heart disease of native coronary artery without angina pectoris: Secondary | ICD-10-CM

## 2019-01-30 NOTE — Patient Instructions (Addendum)
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
## 2019-02-02 DIAGNOSIS — S61215A Laceration without foreign body of left ring finger without damage to nail, initial encounter: Secondary | ICD-10-CM | POA: Diagnosis not present

## 2019-02-13 DIAGNOSIS — Z1231 Encounter for screening mammogram for malignant neoplasm of breast: Secondary | ICD-10-CM | POA: Diagnosis not present

## 2019-03-19 ENCOUNTER — Other Ambulatory Visit: Payer: Self-pay

## 2019-03-19 MED ORDER — PRALUENT 75 MG/ML ~~LOC~~ SOAJ: 75.0000 mg | SUBCUTANEOUS | 1 refills | Status: DC

## 2019-03-23 DIAGNOSIS — Z23 Encounter for immunization: Secondary | ICD-10-CM | POA: Diagnosis not present

## 2019-06-22 ENCOUNTER — Other Ambulatory Visit: Payer: Self-pay

## 2019-06-22 MED ORDER — LOSARTAN POTASSIUM 25 MG PO TABS: 50.0000 mg | ORAL_TABLET | Freq: Every day | ORAL | 3 refills | Status: DC

## 2019-06-22 NOTE — Progress Notes (Signed)
Received pharmacy request for Losartan 25 MG. Refilled for patient since she has an appointment on 06/29/19 to see Dr. Raliegh Ip.

## 2019-06-29 ENCOUNTER — Ambulatory Visit (INDEPENDENT_AMBULATORY_CARE_PROVIDER_SITE_OTHER): Payer: Medicare Other | Admitting: Cardiology

## 2019-06-29 ENCOUNTER — Telehealth: Payer: Self-pay | Admitting: Cardiology

## 2019-06-29 ENCOUNTER — Encounter: Payer: Self-pay | Admitting: Cardiology

## 2019-06-29 ENCOUNTER — Other Ambulatory Visit: Payer: Self-pay

## 2019-06-29 ENCOUNTER — Ambulatory Visit (INDEPENDENT_AMBULATORY_CARE_PROVIDER_SITE_OTHER): Payer: Medicare Other

## 2019-06-29 ENCOUNTER — Encounter: Payer: Self-pay | Admitting: *Deleted

## 2019-06-29 VITALS — BP 140/100 | HR 68 | Ht 68.0 in | Wt 187.0 lb

## 2019-06-29 DIAGNOSIS — I10 Essential (primary) hypertension: Secondary | ICD-10-CM

## 2019-06-29 DIAGNOSIS — R002 Palpitations: Secondary | ICD-10-CM

## 2019-06-29 DIAGNOSIS — E785 Hyperlipidemia, unspecified: Secondary | ICD-10-CM | POA: Diagnosis not present

## 2019-06-29 DIAGNOSIS — R072 Precordial pain: Secondary | ICD-10-CM

## 2019-06-29 DIAGNOSIS — I2510 Atherosclerotic heart disease of native coronary artery without angina pectoris: Secondary | ICD-10-CM

## 2019-06-29 MED ORDER — RANOLAZINE ER 500 MG PO TB12: 500.0000 mg | ORAL_TABLET | Freq: Two times a day (BID) | ORAL | 1 refills | Status: DC

## 2019-06-29 MED ORDER — TELMISARTAN 40 MG PO TABS: 40.0000 mg | ORAL_TABLET | Freq: Every day | ORAL | 1 refills | Status: DC

## 2019-06-29 MED ORDER — NITROGLYCERIN 0.4 MG SL SUBL: 0.4000 mg | SUBLINGUAL_TABLET | SUBLINGUAL | 1 refills | Status: DC | PRN
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
## 2019-06-29 NOTE — Telephone Encounter (Signed)
Pt c/o medication issue:  1. Name of Medication: ranolazine (RANEXA) 500 MG 12 hr tablet citalopram (CELEXA) 10 MG tablet  2. How are you currently taking this medication (dosage and times per day)? Ranexa twice a day, Celexa once a day   3. Are you having a reaction (difficulty breathing--STAT)? no  4. What is your medication issue? Pharmacy calling stating there is an interaction between the two medications. They are calling to make sure it's okay to fill it.

## 2019-06-29 NOTE — Telephone Encounter (Signed)
SPoke with Dr Agustin Cree. He says that it is OK to take citalopram and ranexa. Called [facility] to inform.

## 2019-06-29 NOTE — Patient Instructions (Addendum)
Medication Instructions:  Your physician has recommended you make the following change in your medication:   STOP; Metoprolol STOP: Losartan  START: Ranexa(ranolazine) 500 mg Take 1 tab twice daily START: Micardis(telmisartan) 40 mg Take 1 tab daily  *If you need a refill on your cardiac medications before your next appointment, please call your pharmacy*  Lab Work: Your physician recommends that you return for lab work in: [place]  If you have labs (blood work) drawn today and your tests are completely normal, you will receive your results only by: [name] MyChart Message (if you have MyChart) OR . A paper copy in the mail If you have any lab test that is abnormal or we need to change your treatment, we will call you to review the results.  Testing/Procedures: Your physician has requested that you have a lexiscan myoview. For further information please visit [email or web address]. Please follow instruction sheet, as given.  A zio monitor was ordered today. It will remain on for 7 days. You will then return monitor and event diary in provided box. It takes 1-2 weeks for report to be downloaded and returned to [place]. We will call you with the results. If monitor falls off or has orange flashing light, please call [employer] for further instructions.      Follow-Up: At [facility], you and your health needs are our priority.  As part of our continuing mission to provide you with exceptional heart care, we have created designated Provider Care Teams.  These Care Teams include your primary Cardiologist (physician) and Advanced Practice Providers (APPs -  Physician Assistants and Nurse Practitioners) who all work together to provide you with the care you need, when you need it.  Your next appointment:   6 week(s)  The format for your next appointment:   In Person  Provider:   Jenne Campus, MD  Other Instructions Telmisartan Tablets What is this medicine? TELMISARTAN (tel mi SAR tan) is  an angiotensin II receptor blocker, also known as an ARB. It treats high blood pressure. It may also be used to lower the risk of stroke or heart attack. This medicine may be used for other purposes; ask your health care provider or pharmacist if you have questions. COMMON BRAND NAME(S): Micardis What should I tell my health care provider before I take this medicine? They need to know if you have any of these conditions:  diet low in salt  kidney disease  liver disease  low levels of sodium in the blood  an unusual or allergic reaction to telmisartan, other medicines, foods, dyes, or preservatives  pregnant or trying to get pregnant  breast-feeding How should I use this medicine? Take this drug by mouth. Take it as directed on the prescription label at the same time every day. You can take it with or without food. If it upsets your stomach, take it with food. Keep taking it unless your health care provider tells you to stop. Talk to your health care provider about the use of this drug in children. Special care may be needed. Overdosage: If you think you have taken too much of this medicine contact a poison control center or emergency room at once. NOTE: This medicine is only for you. Do not share this medicine with others. What if I miss a dose? If you miss a dose, take it as soon as you can. If it is almost time for your next dose, take only that dose. Do not take double or extra doses. What  may interact with this medicine?  certain medicines for blood pressure, heart disease, irregular heart beat  diuretics  lithium  NSAIDs, medicines for pain and inflammation, like ibuprofen or naproxen This list may not describe all possible interactions. Give your health care provider a list of all the medicines, herbs, non-prescription drugs, or dietary supplements you use. Also tell them if you smoke, drink alcohol, or use illegal drugs. Some items may interact with your medicine. What  should I watch for while using this medicine? Visit your health care provider for regular checks on your progress. Check your blood pressure as directed. Ask your health care provider what your blood pressure should be. Also find out when you should contact him or her. Women should inform their health care provider if they wish to become pregnant or think they might be pregnant. There is a potential for serious side effects and harm to an unborn child, particularly in the second or third trimester. Talk to your health care provider for more information. You may get dizzy. Do not drive, use machinery, or do anything that needs mental alertness until you know how this drug affects you. Do not stand or sit up quickly, especially if you are an older patient. This reduces the risk of dizzy or fainting spells. Avoid alcoholic drinks; they can make you more dizzy. Avoid salt substitutes unless you are told otherwise by your health care provider. Do not treat yourself for coughs, colds, or pain while you are taking this drug without asking your health care provider for advice. Some drugs may increase your blood pressure. What side effects may I notice from receiving this medicine? Side effects that you should report to your doctor or health care professional as soon as possible:  allergic reactions (skin rash, itching or hives; swelling of the face, lips, or tongue)  high potassium levels (chest pain; fast, irregular heartbeat; muscle weakness)  kidney injury (trouble passing urine or change in the amount of urine)  low blood pressure (dizziness; feeling faint or lightheaded, falls; unusually weak or tired) Side effects that usually do not require medical attention (report to your doctor or health care professional if they continue or are bothersome):  back pain  change in sex drive or performance  diarrhea  headache  stuffy nose This list may not describe all possible side effects. Call your  doctor for medical advice about side effects. You may report side effects to FDA at 1-800-FDA-1088. Where should I keep my medicine? Keep out of the reach of children and pets. Store at room temperature between 15 and 30 degrees C (59 and 86 degrees F). Keep this drug in the original packaging until you are ready to take it. Throw away any unused drug after the expiration date. NOTE: This sheet is a summary. It may not cover all possible information. If you have questions about this medicine, talk to your doctor, pharmacist, or health care provider.  2020 Elsevier/Gold Standard (2019-02-12 14:00:59) Ranolazine tablets, extended release What is this medicine? RANOLAZINE (ra NOE la zeen) is a heart medicine. It is used to treat chronic chest pain (angina). This medicine must be taken regularly. It will not relieve an acute episode of chest pain. This medicine may be used for other purposes; ask your health care provider or pharmacist if you have questions. COMMON BRAND NAME(S): Ranexa What should I tell my health care provider before I take this medicine? They need to know if you have any of these conditions:  heart  disease  irregular heartbeat  kidney disease  liver disease  low levels of potassium or magnesium in the blood  an unusual or allergic reaction to ranolazine, other medicines, foods, dyes, or preservatives  pregnant or trying to get pregnant  breast-feeding How should I use this medicine? Take this medicine by mouth with a glass of water. Follow the directions on the prescription label. Do not cut, crush, or chew this medicine. Take with or without food. Do not take this medication with grapefruit juice. Take your doses at regular intervals. Do not take your medicine more often then directed. Talk to your pediatrician regarding the use of this medicine in children. Special care may be needed. Overdosage: If you think you have taken too much of this medicine contact a poison  control center or emergency room at once. NOTE: This medicine is only for you. Do not share this medicine with others. What if I miss a dose? If you miss a dose, take it as soon as you can. If it is almost time for your next dose, take only that dose. Do not take double or extra doses. What may interact with this medicine? Do not take this medicine with any of the following medications:  antivirals for HIV or AIDS  cerivastatin  certain antibiotics like chloramphenicol, clarithromycin, dalfopristin; quinupristin, isoniazid, rifabutin, rifampin, rifapentine  certain medicines used for cancer like imatinib, nilotinib  certain medicines for fungal infections like fluconazole, itraconazole, ketoconazole, posaconazole, voriconazole  certain medicines for irregular heart beat like dronedarone  certain medicines for seizures like carbamazepine, fosphenytoin, oxcarbazepine, phenobarbital, phenytoin  cisapride  conivaptan  cyclosporine  grapefruit or grapefruit juice  lumacaftor; ivacaftor  nefazodone  pimozide  quinacrine  St John's wort  thioridazine This medicine may also interact with the following medications:  alfuzosin  certain medicines for depression, anxiety, or psychotic disturbances like bupropion, citalopram, fluoxetine, fluphenazine, paroxetine, perphenazine, risperidone, sertraline, trifluoperazine  certain medicines for cholesterol like atorvastatin, lovastatin, simvastatin  certain medicines for stomach problems like octreotide, palonosetron, prochlorperazine  eplerenone  ergot alkaloids like dihydroergotamine, ergonovine, ergotamine, methylergonovine  metformin  nicardipine  other medicines that prolong the QT interval (cause an abnormal heart rhythm) like dofetilide, ziprasidone  sirolimus  tacrolimus This list may not describe all possible interactions. Give your health care provider a list of all the medicines, herbs, non-prescription drugs,  or dietary supplements you use. Also tell them if you smoke, drink alcohol, or use illegal drugs. Some items may interact with your medicine. What should I watch for while using this medicine? Visit your doctor for regular check ups. Tell your doctor or healthcare professional if your symptoms do not start to get better or if they get worse. This medicine will not relieve an acute attack of angina or chest pain. This medicine can change your heart rhythm. Your health care provider may check your heart rhythm by ordering an electrocardiogram (ECG) while you are taking this medicine. You may get drowsy or dizzy. Do not drive, use machinery, or do anything that needs mental alertness until you know how this medicine affects you. Do not stand or sit up quickly, especially if you are an older patient. This reduces the risk of dizzy or fainting spells. Alcohol may interfere with the effect of this medicine. Avoid alcoholic drinks. If you are scheduled for any medical or dental procedure, tell your healthcare provider that you are taking this medicine. This medicine can interact with other medicines used during surgery. What side effects may I  notice from receiving this medicine? Side effects that you should report to your doctor or health care professional as soon as possible:  allergic reactions like skin rash, itching or hives, swelling of the face, lips, or tongue  breathing problems  changes in vision  fast, irregular or pounding heartbeat  feeling faint or lightheaded, falls  low or high blood pressure  numbness or tingling feelings  ringing in the ears  tremor or shakiness  slow heartbeat (fewer than 50 beats per minute)  swelling of the legs or feet Side effects that usually do not require medical attention (report to your doctor or health care professional if they continue or are bothersome):  constipation  drowsy  dry mouth  headache  nausea or vomiting  stomach  upset This list may not describe all possible side effects. Call your doctor for medical advice about side effects. You may report side effects to FDA at 1-800-FDA-1088. Where should I keep my medicine? Keep out of the reach of children. Store at room temperature between 15 and 30 degrees C (59 and 86 degrees F). Throw away any unused medicine after the expiration date. NOTE: This sheet is a summary. It may not cover all possible information. If you have questions about this medicine, talk to your doctor, pharmacist, or health care provider.  2020 Elsevier/Gold Standard (2018-04-25 09:18:49)

## 2019-06-30 LAB — LIPID PANEL
Chol/HDL Ratio: 2.6 ratio (ref 0.0–4.4)
Cholesterol, Total: 152 mg/dL (ref 100–199)
HDL: 59 mg/dL (ref >39)
LDL Chol Calc (NIH): 72 mg/dL (ref 0–99)
Triglycerides: 117 mg/dL (ref 0–149)
VLDL Cholesterol Cal: 21 mg/dL (ref 5–40)

## 2019-07-09 DIAGNOSIS — I2510 Atherosclerotic heart disease of native coronary artery without angina pectoris: Secondary | ICD-10-CM | POA: Diagnosis not present

## 2019-07-09 DIAGNOSIS — E559 Vitamin D deficiency, unspecified: Secondary | ICD-10-CM | POA: Diagnosis not present

## 2019-07-09 DIAGNOSIS — Z9861 Coronary angioplasty status: Secondary | ICD-10-CM | POA: Diagnosis not present

## 2019-07-09 DIAGNOSIS — J45909 Unspecified asthma, uncomplicated: Secondary | ICD-10-CM | POA: Diagnosis not present

## 2019-07-09 DIAGNOSIS — E785 Hyperlipidemia, unspecified: Secondary | ICD-10-CM | POA: Diagnosis not present

## 2019-07-09 DIAGNOSIS — I10 Essential (primary) hypertension: Secondary | ICD-10-CM | POA: Diagnosis not present

## 2019-07-09 DIAGNOSIS — Z79899 Other long term (current) drug therapy: Secondary | ICD-10-CM | POA: Diagnosis not present

## 2019-07-14 DIAGNOSIS — R002 Palpitations: Secondary | ICD-10-CM | POA: Diagnosis not present

## 2019-07-18 ENCOUNTER — Other Ambulatory Visit: Payer: Self-pay | Admitting: Sports Medicine

## 2019-07-18 ENCOUNTER — Ambulatory Visit (INDEPENDENT_AMBULATORY_CARE_PROVIDER_SITE_OTHER): Payer: Medicare Other | Admitting: Sports Medicine

## 2019-07-18 ENCOUNTER — Other Ambulatory Visit: Payer: Self-pay

## 2019-07-18 ENCOUNTER — Ambulatory Visit (INDEPENDENT_AMBULATORY_CARE_PROVIDER_SITE_OTHER): Payer: Medicare Other

## 2019-07-18 ENCOUNTER — Encounter: Payer: Self-pay | Admitting: Sports Medicine

## 2019-07-18 DIAGNOSIS — M7741 Metatarsalgia, right foot: Secondary | ICD-10-CM | POA: Diagnosis not present

## 2019-07-18 DIAGNOSIS — M19079 Primary osteoarthritis, unspecified ankle and foot: Secondary | ICD-10-CM

## 2019-07-18 DIAGNOSIS — M21619 Bunion of unspecified foot: Secondary | ICD-10-CM

## 2019-07-18 DIAGNOSIS — M2041 Other hammer toe(s) (acquired), right foot: Secondary | ICD-10-CM | POA: Diagnosis not present

## 2019-07-18 DIAGNOSIS — L84 Corns and callosities: Secondary | ICD-10-CM

## 2019-07-18 DIAGNOSIS — I2510 Atherosclerotic heart disease of native coronary artery without angina pectoris: Secondary | ICD-10-CM | POA: Diagnosis not present

## 2019-07-18 DIAGNOSIS — M2040 Other hammer toe(s) (acquired), unspecified foot: Secondary | ICD-10-CM | POA: Diagnosis not present

## 2019-07-18 DIAGNOSIS — M79671 Pain in right foot: Secondary | ICD-10-CM

## 2019-07-18 NOTE — Progress Notes (Signed)
Subjective: Emily Ayers is a 69 y.o. female patient who presents to office for evaluation of Right foot pain at the ball of the foot especially when she is walking for exercise and secondary to callus skin in between the first and second toes. Patient complains of pain at the lesion present Right foot that is slowly getting worse and reports that when she walks for exercise she feels like the ball of her foot is swelling and is very tender pain is 6-7 out of 10 after she walks and takes a break the pain received and goes away and reports that her Celebrex helps as well.  Patient admits to a long-term history of hammertoes and is concerned about these toes as well and reports that she has noticed her foot changing where she has been having to buy a larger size shoe.  No other pedal complaints noted.  Review of Systems  All other systems reviewed and are negative.    Patient Active Problem List   Diagnosis Date Noted  . Left hip pain 07/07/2017  . Trochanteric bursitis of left hip 07/07/2017  . Atypical chest pain 06/08/2016  . Coronary artery disease involving native coronary artery of native heart without angina pectoris 05/29/2015  . Dyslipidemia 05/29/2015  . Essential hypertension 05/29/2015    Current Outpatient Medications on File Prior to Visit  Medication Sig Dispense Refill  . albuterol (PROVENTIL HFA;VENTOLIN HFA) 108 (90 BASE) MCG/ACT inhaler Inhale 2 puffs into the lungs every 6 (six) hours as needed for wheezing.    . Alirocumab (PRALUENT) 75 MG/ML SOAJ Inject 75 mg into the skin every 14 (fourteen) days. 6 pen 1  . ALPRAZolam (XANAX) 0.5 MG tablet Take 0.5 mg by mouth at bedtime as needed for anxiety or sleep.     [name] aspirin EC 81 MG tablet Take 81 mg by mouth daily.    . citalopram (CELEXA) 10 MG tablet Take 10 mg by mouth daily.    . Cranberry 300 MG tablet Take 300 mg by mouth 2 (two) times daily.    . diphenhydrAMINE (BENADRYL) 25 MG tablet Take 12.5 mg by mouth at  bedtime.    . DULERA 200-5 MCG/ACT AERO Inhale 2 puffs into the lungs daily.     . ergocalciferol (VITAMIN D2) 50000 UNITS capsule Take 1 capsule (50,000 Units total) by mouth every 14 (fourteen) days. (Patient taking differently: Take 50,000 Units by mouth once a week. ) 6 capsule 4  . NASONEX 50 MCG/ACT nasal spray Place 2 sprays into the nose daily.     . nitroGLYCERIN (NITROSTAT) 0.4 MG SL tablet Place 1 tablet (0.4 mg total) under the tongue as needed for chest pain. 25 tablet 1  . Omega-3 1000 MG CAPS Take 1,000 mg by mouth.    [name] omeprazole (PRILOSEC) 20 MG capsule Take 20 mg by mouth daily.    . ranolazine (RANEXA) 500 MG 12 hr tablet Take 1 tablet (500 mg total) by mouth 2 (two) times daily. 180 tablet 1  . telmisartan (MICARDIS) 40 MG tablet Take 1 tablet (40 mg total) by mouth daily. 90 tablet 1   No current facility-administered medications on file prior to visit.    Allergies  Allergen Reactions  . Morphine And Related Itching  . Buprenorphine Hcl Itching    Objective:  General: Alert and oriented x3 in no acute distress  Dermatology: Keratotic lesion present first interspace on right with skin lines transversing the lesion, pain is present with direct pressure to  the lesion with a central nucleated core noted, no webspace macerations, no ecchymosis bilateral, all nails x 10 are well manicured.  Vascular: Dorsalis Pedis and Posterior Tibial pedal pulses 1/4, Capillary Fill Time 3 seconds, + pedal hair growth bilateral, no edema bilateral lower extremities, Temperature gradient within normal limits.  Neurology: Johney Maine sensation intact via light touch bilateral.  Musculoskeletal: Mild tenderness with palpation at the keratotic lesion site on Right first interspace, significant bunion and hammertoe deformity right greater than left, there is also fat pad atrophy noted.  Muscular strength 5/5 in all groups without pain or limitation on range of motion.  X-rays consistent with  arthritis at the first metatarsophalangeal joint and bunion deformity with hallux deviation/pronation and impingement onto the second toe where there is lesser hammertoe deformity noted.  No other acute findings.  Assessment and Plan: Problem List Items Addressed This Visit    None    Visit Diagnoses    Metatarsalgia of right foot    -  Primary   Corns and callosities       Bunion       Hammer toe, unspecified laterality       Arthritis of foot       Right foot pain          -Complete examination performed -X-rays reviewed -Discussed treatment options -Parred keratoic lesion using a 15 blade at first interspace without incident at no additional charge -Encouraged daily skin emollients -Encouraged use of pumice stone -Advised good supportive shoes and inserts especially while walking today we dispensed a metatarsal padding sleeve and advised patient this works well may benefit from custom orthotics -Patient to return to office as needed or sooner if condition worsens.  Landis Martins, DPM

## 2019-07-24 ENCOUNTER — Telehealth: Payer: Self-pay | Admitting: *Deleted

## 2019-07-24 ENCOUNTER — Encounter: Payer: Self-pay | Admitting: *Deleted

## 2019-07-24 NOTE — Telephone Encounter (Signed)
Patient given detailed instructions per Myocardial Perfusion Study Information Sheet for the test on 07/31/2019 at 0800. Patient notified to arrive 15 minutes early and that it is imperative to arrive on time for appointment to keep from having the test rescheduled.  If you need to cancel or reschedule your appointment, please call the office within 24 hours of your appointment. . Patient verbalized understanding.Burgundy Matuszak, Ranae Palms

## 2019-07-31 ENCOUNTER — Ambulatory Visit (INDEPENDENT_AMBULATORY_CARE_PROVIDER_SITE_OTHER): Payer: Medicare Other

## 2019-07-31 ENCOUNTER — Other Ambulatory Visit: Payer: Self-pay

## 2019-07-31 VITALS — Ht 68.0 in | Wt 187.0 lb

## 2019-07-31 DIAGNOSIS — R072 Precordial pain: Secondary | ICD-10-CM | POA: Diagnosis not present

## 2019-07-31 DIAGNOSIS — I2510 Atherosclerotic heart disease of native coronary artery without angina pectoris: Secondary | ICD-10-CM | POA: Diagnosis not present

## 2019-07-31 LAB — MYOCARDIAL PERFUSION IMAGING
LV dias vol: 62 mL (ref 46–106)
LV sys vol: 23 mL
Peak HR: 87 {beats}/min
Rest HR: 65 {beats}/min
SDS: 2
SRS: 4
SSS: 6
TID: 1.05

## 2019-07-31 MED ORDER — TECHNETIUM TC 99M TETROFOSMIN IV KIT
10.9000 | PACK | Freq: Once | INTRAVENOUS | Status: AC | PRN
  Administered 2019-07-31: 10.9 via INTRAVENOUS

## 2019-07-31 MED ORDER — REGADENOSON 0.4 MG/5ML IV SOLN
0.4000 mg | Freq: Once | INTRAVENOUS | Status: AC
  Administered 2019-07-31: 0.4 mg via INTRAVENOUS

## 2019-07-31 MED ORDER — TECHNETIUM TC 99M TETROFOSMIN IV KIT
31.1000 | PACK | Freq: Once | INTRAVENOUS | Status: AC | PRN
  Administered 2019-07-31: 31.1 via INTRAVENOUS

## 2019-08-02 ENCOUNTER — Telehealth: Payer: Self-pay | Admitting: Emergency Medicine

## 2019-08-02 NOTE — Telephone Encounter (Signed)
Dr. Agustin Cree wanted a ekg on this patient. The patient just had a nuclear test on 07/31/2019. Will check to see if the baseline ekg is sufficient for Dr. Agustin Cree.

## 2019-08-06 ENCOUNTER — Other Ambulatory Visit: Payer: Self-pay | Admitting: Sports Medicine

## 2019-08-06 DIAGNOSIS — M7741 Metatarsalgia, right foot: Secondary | ICD-10-CM

## 2019-08-06 DIAGNOSIS — M19079 Primary osteoarthritis, unspecified ankle and foot: Secondary | ICD-10-CM

## 2019-08-06 DIAGNOSIS — M2041 Other hammer toe(s) (acquired), right foot: Secondary | ICD-10-CM

## 2019-08-16 ENCOUNTER — Other Ambulatory Visit: Payer: Self-pay

## 2019-08-16 ENCOUNTER — Ambulatory Visit (INDEPENDENT_AMBULATORY_CARE_PROVIDER_SITE_OTHER): Payer: Medicare Other | Admitting: Sports Medicine

## 2019-08-16 ENCOUNTER — Encounter: Payer: Self-pay | Admitting: Sports Medicine

## 2019-08-16 DIAGNOSIS — M7741 Metatarsalgia, right foot: Secondary | ICD-10-CM | POA: Diagnosis not present

## 2019-08-16 DIAGNOSIS — M79671 Pain in right foot: Secondary | ICD-10-CM

## 2019-08-16 DIAGNOSIS — I2510 Atherosclerotic heart disease of native coronary artery without angina pectoris: Secondary | ICD-10-CM

## 2019-08-16 DIAGNOSIS — M2040 Other hammer toe(s) (acquired), unspecified foot: Secondary | ICD-10-CM

## 2019-08-16 DIAGNOSIS — M21619 Bunion of unspecified foot: Secondary | ICD-10-CM

## 2019-08-16 DIAGNOSIS — L84 Corns and callosities: Secondary | ICD-10-CM | POA: Diagnosis not present

## 2019-08-16 DIAGNOSIS — M19079 Primary osteoarthritis, unspecified ankle and foot: Secondary | ICD-10-CM

## 2019-08-16 NOTE — Progress Notes (Signed)
Subjective: Emily Ayers is a 69 y.o. female patient who returns to office for follow-up evaluation of right foot pain.  Patient reports that her foot pain is doing much better and the metatarsal padding has helped greatly.  Patient reports that she has increased her activity and went back to walking for exercise and states that she does not have any pain while doing her activity but sometimes after she finishes had a small episode of pain that goes away as soon as she takes her shoes off and elevates.  Patient reports that she is pleased with the padding and the progress that she has made.  No other pedal complaints noted.   Patient Active Problem List   Diagnosis Date Noted  . Left hip pain 07/07/2017  . Trochanteric bursitis of left hip 07/07/2017  . Atypical chest pain 06/08/2016  . Coronary artery disease involving native coronary artery of native heart without angina pectoris 05/29/2015  . Dyslipidemia 05/29/2015  . Essential hypertension 05/29/2015    Current Outpatient Medications on File Prior to Visit  Medication Sig Dispense Refill  . albuterol (PROVENTIL HFA;VENTOLIN HFA) 108 (90 BASE) MCG/ACT inhaler Inhale 2 puffs into the lungs every 6 (six) hours as needed for wheezing.    . Alirocumab (PRALUENT) 75 MG/ML SOAJ Inject 75 mg into the skin every 14 (fourteen) days. 6 pen 1  . ALPRAZolam (XANAX) 0.5 MG tablet Take 0.5 mg by mouth at bedtime as needed for anxiety or sleep.     [name] aspirin EC 81 MG tablet Take 81 mg by mouth daily.    . celecoxib (CELEBREX) 200 MG capsule Take 200 mg by mouth 2 (two) times daily.    . citalopram (CELEXA) 10 MG tablet Take 10 mg by mouth daily.    . Cranberry 300 MG tablet Take 300 mg by mouth 2 (two) times daily.    . diphenhydrAMINE (BENADRYL) 25 MG tablet Take 12.5 mg by mouth at bedtime.    . DULERA 200-5 MCG/ACT AERO Inhale 2 puffs into the lungs daily.     . ergocalciferol (VITAMIN D2) 50000 UNITS capsule Take 1 capsule (50,000 Units  total) by mouth every 14 (fourteen) days. (Patient taking differently: Take 50,000 Units by mouth once a week. ) 6 capsule 4  . NASONEX 50 MCG/ACT nasal spray Place 2 sprays into the nose daily.     . nitroGLYCERIN (NITROSTAT) 0.4 MG SL tablet Place 1 tablet (0.4 mg total) under the tongue as needed for chest pain. 25 tablet 1  . Omega-3 1000 MG CAPS Take 1,000 mg by mouth.    [name] omeprazole (PRILOSEC) 20 MG capsule Take 20 mg by mouth daily.    . ranolazine (RANEXA) 500 MG 12 hr tablet Take 1 tablet (500 mg total) by mouth 2 (two) times daily. 180 tablet 1  . telmisartan (MICARDIS) 40 MG tablet Take 1 tablet (40 mg total) by mouth daily. 90 tablet 1   No current facility-administered medications on file prior to visit.    Allergies  Allergen Reactions  . Morphine And Related Itching  . Buprenorphine Hcl Itching    Objective:  General: Alert and oriented x3 in no acute distress  Dermatology: Minimal keratotic lesion present first interspace on right with skin lines transversing the lesion, minimal pain is present with direct pressure to the lesion with a central nucleated core noted, no webspace macerations, no ecchymosis bilateral, all nails x 10 are well manicured.  Vascular: Dorsalis Pedis and Posterior Tibial pedal pulses  1/4, Capillary Fill Time 3 seconds, + pedal hair growth bilateral, no edema bilateral lower extremities, Temperature gradient within normal limits.  Neurology: Johney Maine sensation intact via light touch bilateral.  Musculoskeletal: Minimal tenderness with palpation at the keratotic lesion site on Right first interspace, significant bunion and hammertoe deformity right greater than left, there is also fat pad atrophy noted.  Muscular strength 5/5 in all groups without pain or limitation on range of motion.  Assessment and Plan: Problem List Items Addressed This Visit    None    Visit Diagnoses    Metatarsalgia of right foot    -  Primary   Corns and callosities        Bunion       Hammer toe, unspecified laterality       Arthritis of foot       Right foot pain         -Complete examination performed -Advised patient since she is doing well to continue with metatarsal padding -Encouraged patient to continue with daily skin emollients -Encouraged use of pumice stone as needed -Advised good supportive shoes and over-the-counter inserts as needed since patient wants to hold off on any custom orthotics at this time -Patient to return to office as needed or sooner if condition worsens.  Landis Martins, DPM

## 2019-08-17 ENCOUNTER — Ambulatory Visit (INDEPENDENT_AMBULATORY_CARE_PROVIDER_SITE_OTHER): Payer: Medicare Other | Admitting: Cardiology

## 2019-08-17 ENCOUNTER — Encounter: Payer: Self-pay | Admitting: Cardiology

## 2019-08-17 VITALS — BP 152/96 | HR 79 | Temp 97.1°F | Ht 68.0 in | Wt 182.0 lb

## 2019-08-17 DIAGNOSIS — I2510 Atherosclerotic heart disease of native coronary artery without angina pectoris: Secondary | ICD-10-CM

## 2019-08-17 DIAGNOSIS — R0789 Other chest pain: Secondary | ICD-10-CM

## 2019-08-17 DIAGNOSIS — I4729 Other ventricular tachycardia: Secondary | ICD-10-CM

## 2019-08-17 DIAGNOSIS — I472 Ventricular tachycardia: Secondary | ICD-10-CM

## 2019-08-17 DIAGNOSIS — E785 Hyperlipidemia, unspecified: Secondary | ICD-10-CM | POA: Diagnosis not present

## 2019-08-17 DIAGNOSIS — I10 Essential (primary) hypertension: Secondary | ICD-10-CM | POA: Diagnosis not present

## 2019-08-17 HISTORY — DX: Ventricular tachycardia: I47.2

## 2019-08-17 HISTORY — DX: Other ventricular tachycardia: I47.29
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
## 2019-08-17 NOTE — Patient Instructions (Signed)
Medication Instructions:  Your physician recommends that you continue on your current medications as directed. Please refer to the Current Medication list given to you today.  *If you need a refill on your cardiac medications before your next appointment, please call your pharmacy*   Lab Work: None ordered  If you have labs (blood work) drawn today and your tests are completely normal, you will receive your results only by: . MyChart Message (if you have MyChart) OR . A paper copy in the mail If you have any lab test that is abnormal or we need to change your treatment, we will call you to review the results.   Testing/Procedures: None ordered    Follow-Up: At [facility], you and your health needs are our priority.  As part of our continuing mission to provide you with exceptional heart care, we have created designated Provider Care Teams.  These Care Teams include your primary Cardiologist (physician) and Advanced Practice Providers (APPs -  Physician Assistants and Nurse Practitioners) who all work together to provide you with the care you need, when you need it.  We recommend signing up for the patient portal called "MyChart".  Sign up information is provided on this After Visit Summary.  MyChart is used to connect with patients for Virtual Visits (Telemedicine).  Patients are able to view lab/test results, encounter notes, upcoming appointments, etc.  Non-urgent messages can be sent to your provider as well.   To learn more about what you can do with MyChart, go to [email or web address].    Your next appointment:   3 month(s)  The format for your next appointment:   In Person  Provider:   Robert Krasowski, MD   Other Instructions None   

## 2019-10-02 DIAGNOSIS — Z9181 History of falling: Secondary | ICD-10-CM | POA: Diagnosis not present

## 2019-10-02 DIAGNOSIS — Z0000 Encounter for general adult medical examination without abnormal findings: Secondary | ICD-10-CM | POA: Diagnosis not present

## 2019-10-02 DIAGNOSIS — Z1331 Encounter for screening for depression: Secondary | ICD-10-CM | POA: Diagnosis not present

## 2019-10-02 DIAGNOSIS — Z1231 Encounter for screening mammogram for malignant neoplasm of breast: Secondary | ICD-10-CM | POA: Diagnosis not present

## 2019-10-02 DIAGNOSIS — E785 Hyperlipidemia, unspecified: Secondary | ICD-10-CM | POA: Diagnosis not present

## 2019-10-12 IMAGING — MR MR HEAD WO/W CM
14 of 16 series · 36 of 48 positions shown · IV contrast (multihance)
Comparison: None.

CLINICAL DATA: Third nerve palsy. Double vision in the left eye.
Drooling out of the right side of the mouth. Symptoms for 1 year.

Creatinine was obtained on site at [HOSPITAL] at [HOSPITAL].
Results: Creatinine 1.1 mg/dL.
EXAM:
MRI HEAD AND ORBITS WITHOUT AND WITH CONTRAST
TECHNIQUE: Multiplanar, multiecho pulse sequences of the brain and surrounding
structures were obtained without and with intravenous contrast.
Multiplanar, multiecho pulse sequences of the orbits and surrounding
structures were obtained including fat saturation techniques, before
and after intravenous contrast administration.
CONTRAST:  17mL MULTIHANCE GADOBENATE DIMEGLUMINE 529 MG/ML IV SOLN

[Series 2: t1_se_sag · sagittal · 5.0mm · 0.45mm/px · 1 of 24 slices shown]
[im 1/24]
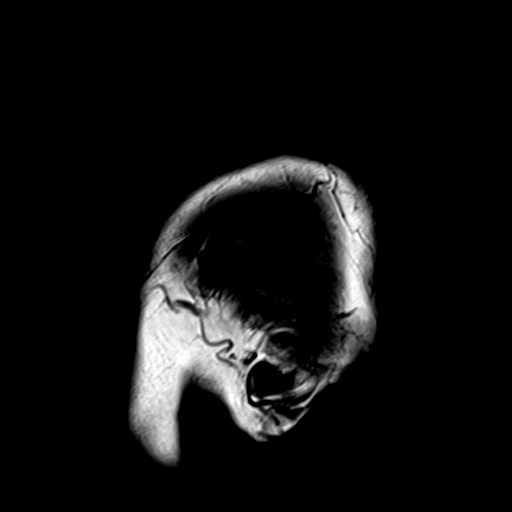

[Series 3: t2_tse_tra_512 · axial · 5.0mm · 0.60mm/px · 1 of 28 slices shown]
[im 1/28]
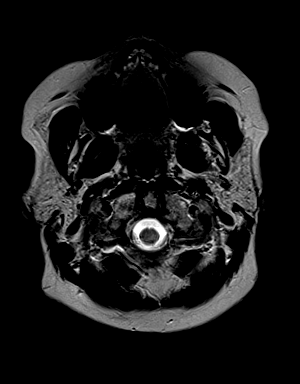

[Series 4: ep2d_diff_(id)_trace · axial · 3.0mm · 1.80mm/px · z∈[-57,+92]mm · 5 of 100 slices shown]
[im 1/100]
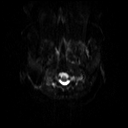
[im 25/100]
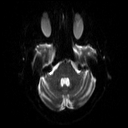
[im 50/100]
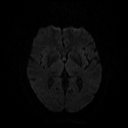
[im 75/100]
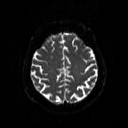
[im 100/100]
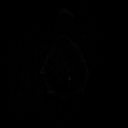

[Series 5: ep2d_diff_(id)_trace_adc · axial · 3.0mm · 1.80mm/px · z∈[-57,+92]mm · 3 of 51 slices shown]
[im 1/51]
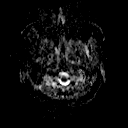
[im 26/51]
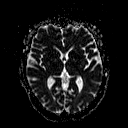
[im 51/51]
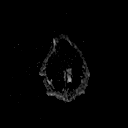

[Series 7: swi_images · axial · 2.0mm · 0.90mm/px · z∈[-58,+99]mm · 5 of 80 slices shown]
[im 1/80]
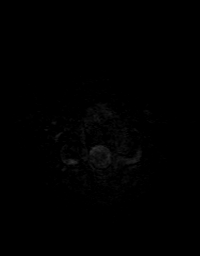
[im 20/80]
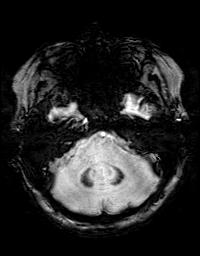
[im 40/80]
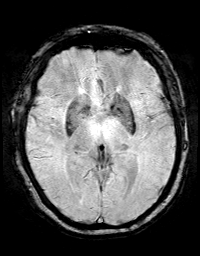
[im 60/80]
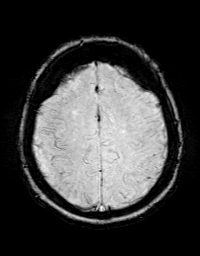
[im 80/80]
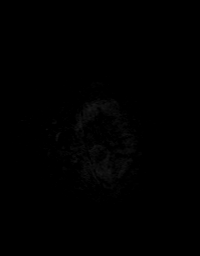

[Series 8: FLAIR · axial · 3.0mm · 0.43mm/px · z∈[-56,+92]mm · 2 of 33 slices shown]
[im 1/33]
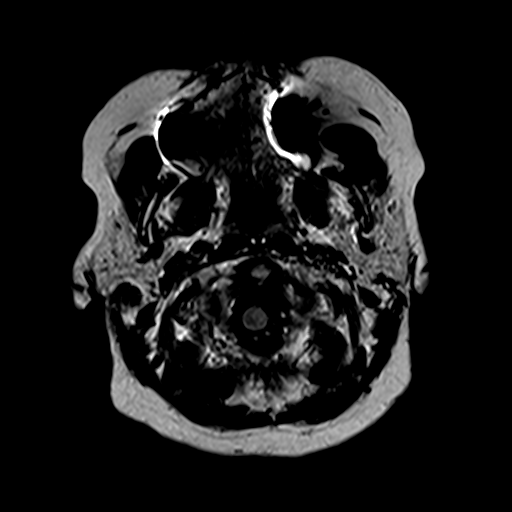
[im 33/33]
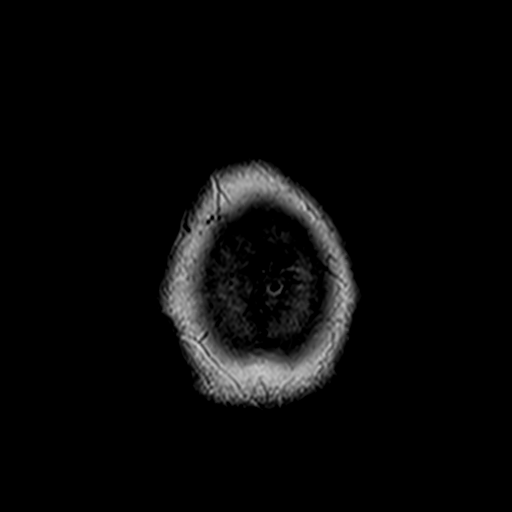

[Series 9: t1_mpr_tra · axial · 1.1mm · 0.72mm/px · z∈[-59,+99]mm · 8 of 144 slices shown]
[im 1/144]
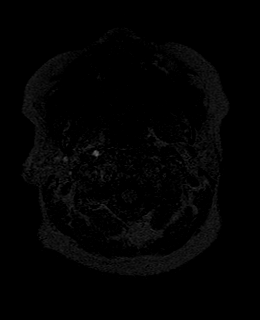
[im 18/144]
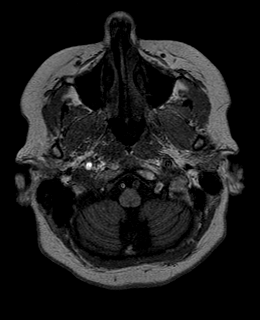
[im 36/144]
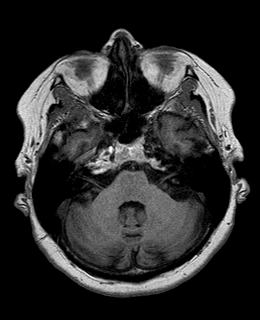
[im 54/144]
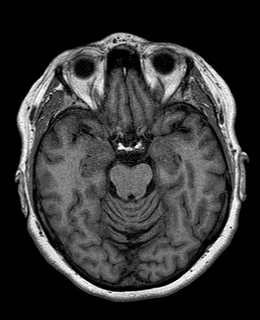
[im 90/144]
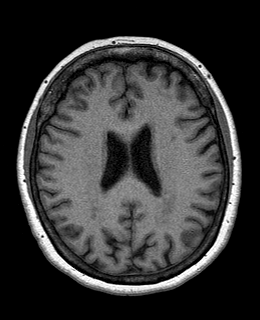
[im 108/144]
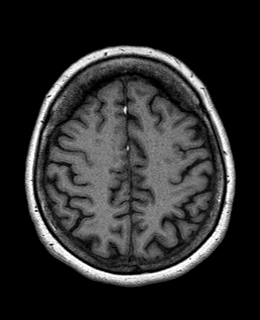
[im 126/144]
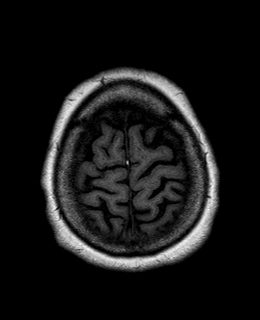
[im 144/144]
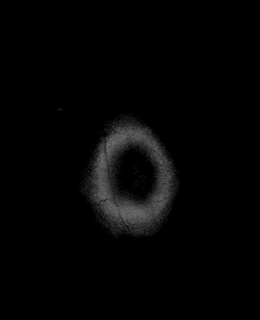

[Series 10: t1_tse cor_3mm · coronal · 3.0mm · 0.35mm/px · 2 of 30 slices shown]
[im 1/30]
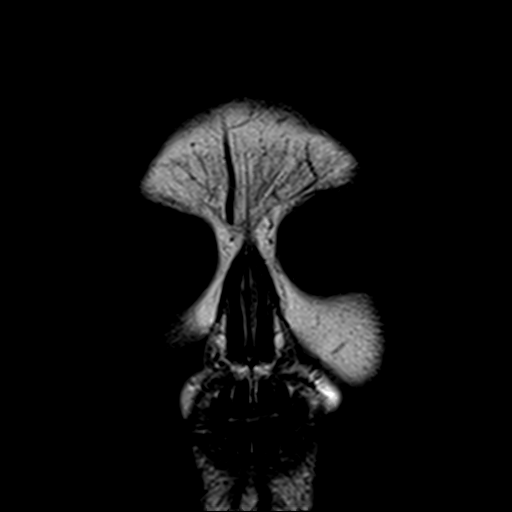
[im 30/30]
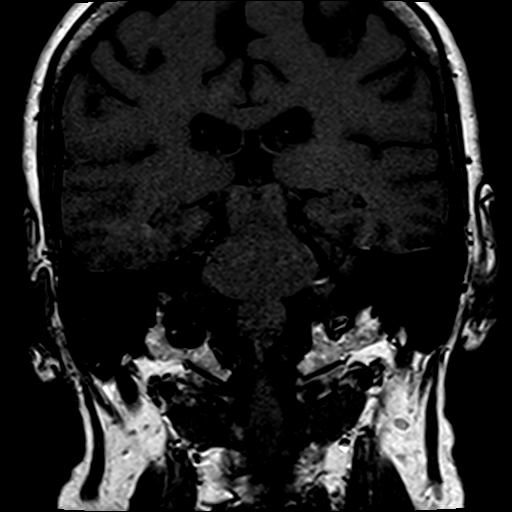

[Series 11: t2_cor_fs_3mm · coronal · 3.0mm · 0.35mm/px · 2 of 30 slices shown]
[im 1/30]
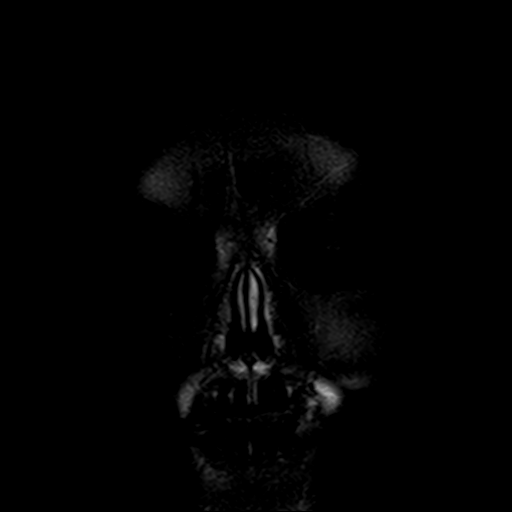
[im 30/30]
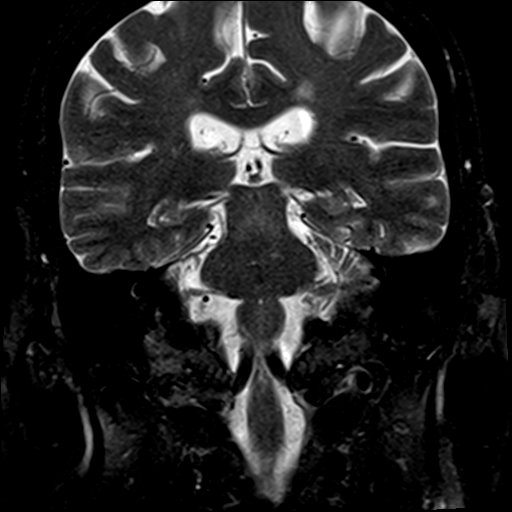

[Series 12: t1_tse axial_3mm · axial · 3.0mm · 0.35mm/px · 1 of 20 slices shown]
[im 1/20]
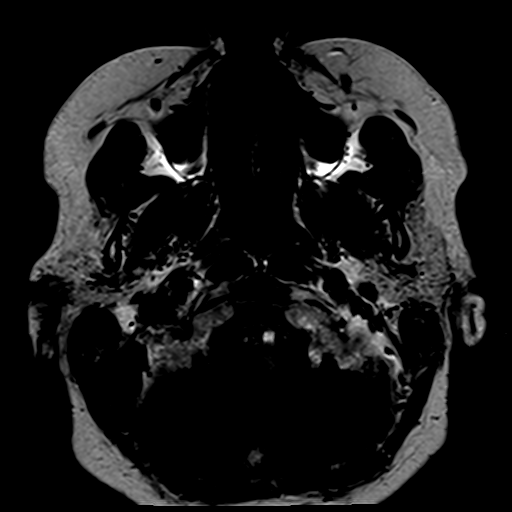

[Series 13: t2_ax_fs_3mm · axial · 3.0mm · 0.35mm/px · 1 of 20 slices shown]
[im 1/20]
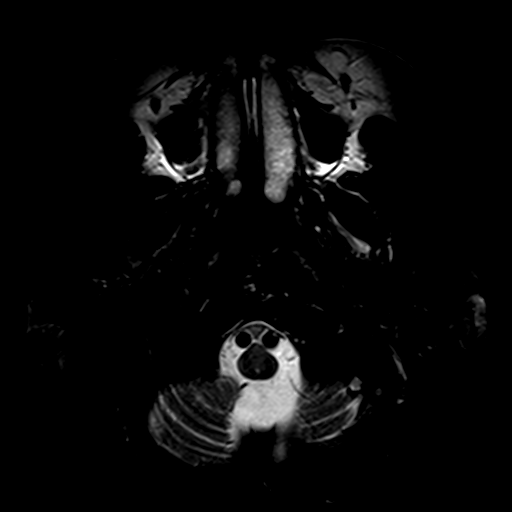

[Series 14: T2 · coronal · 5.0mm · 0.45mm/px · 2 of 27 slices shown]
[im 1/27]
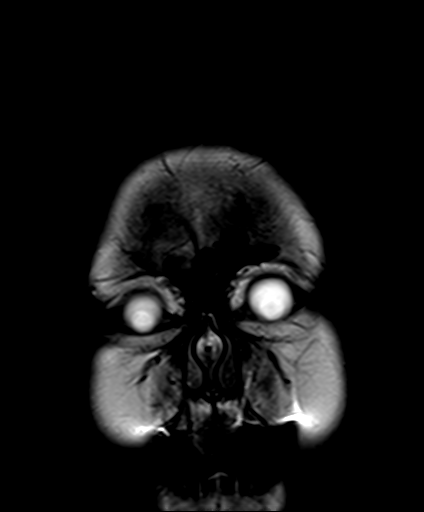
[im 27/27]
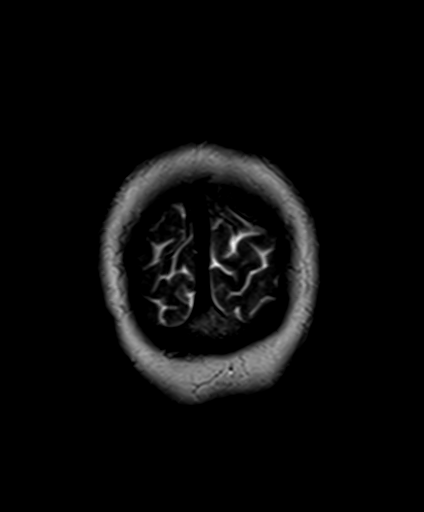

[Series 15: post_t1_tse cor_fs_3mm · coronal · 3.0mm · 0.35mm/px · 1 of 30 slices shown]
[im 1/30]
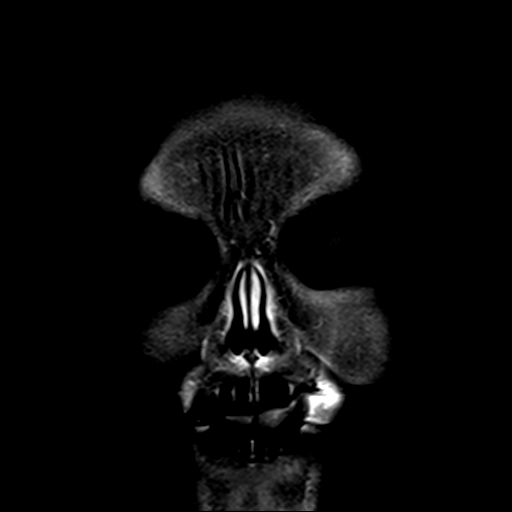

[Series 18: T1 post-contrast · coronal · 5.0mm · 0.45mm/px · 2 of 27 slices shown]
[im 1/27]
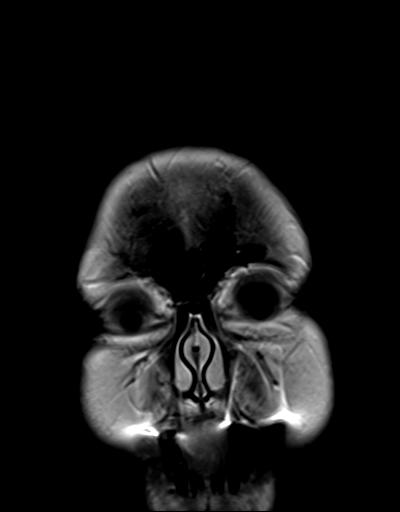
[im 27/27]
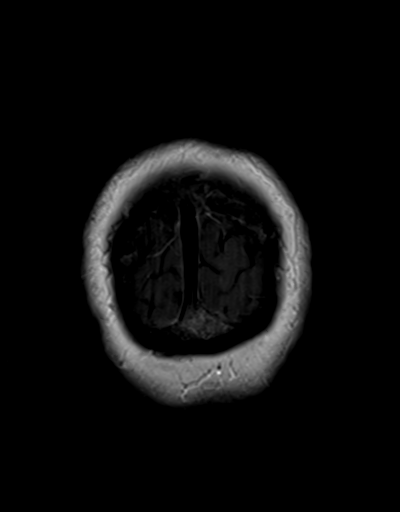

[36 of 48 positions shown; findings below may reference images not displayed]

FINDINGS: MRI HEAD FINDINGS

Brain: There is no evidence of acute infarct, intracranial
hemorrhage, mass, midline shift, or extra-axial fluid collection.
Mild cerebral atrophy is within normal limits for age. Patchy T2
hyperintensities in the cerebral white matter and pons are mildly to
moderately advanced for age and nonspecific. No abnormal enhancement
is identified.

Vascular: Major intracranial vascular flow voids are preserved.

Skull and upper cervical spine: Unremarkable bone marrow signal.

Other: None.

MRI ORBITS FINDINGS

Orbits: Bilateral cataract extraction. Symmetric optic nerve
complexes without evidence of edema or abnormal enhancement. No
evidence of orbital inflammation or mass. Symmetric extra-ocular
muscles and lacrimal glands.

Visualized sinuses: Paranasal sinuses and mastoid air cells are
clear.

Soft tissues: Unremarkable.

Limited intracranial: Unremarkable appearance of the optic chiasm,
pituitary gland, and cavernous sinuses.
IMPRESSION: 1. No acute intracranial abnormality or mass.
2. Mild-to-moderate cerebral white matter T2 signal changes,
nonspecific though most often seen with chronic small vessel
ischemia. Prior infection/inflammation, migraines, and demyelinating
disease are alternative considerations.
3. Negative orbital imaging.

## 2019-10-15 ENCOUNTER — Other Ambulatory Visit: Payer: Self-pay | Admitting: Cardiology

## 2019-11-08 ENCOUNTER — Other Ambulatory Visit: Payer: Self-pay

## 2019-11-16 ENCOUNTER — Ambulatory Visit (INDEPENDENT_AMBULATORY_CARE_PROVIDER_SITE_OTHER): Payer: Medicare Other | Admitting: Cardiology

## 2019-11-16 ENCOUNTER — Other Ambulatory Visit: Payer: Self-pay

## 2019-11-16 ENCOUNTER — Encounter: Payer: Self-pay | Admitting: Cardiology

## 2019-11-16 VITALS — BP 134/88 | HR 86 | Ht 68.0 in | Wt 176.6 lb

## 2019-11-16 DIAGNOSIS — E785 Hyperlipidemia, unspecified: Secondary | ICD-10-CM

## 2019-11-16 DIAGNOSIS — I2510 Atherosclerotic heart disease of native coronary artery without angina pectoris: Secondary | ICD-10-CM | POA: Diagnosis not present

## 2019-11-16 DIAGNOSIS — I10 Essential (primary) hypertension: Secondary | ICD-10-CM | POA: Diagnosis not present

## 2019-11-16 DIAGNOSIS — I4729 Other ventricular tachycardia: Secondary | ICD-10-CM

## 2019-11-16 DIAGNOSIS — I472 Ventricular tachycardia: Secondary | ICD-10-CM

## 2019-11-16 DIAGNOSIS — R0789 Other chest pain: Secondary | ICD-10-CM

## 2019-11-16 MED ORDER — VALSARTAN 160 MG PO TABS: 160.0000 mg | ORAL_TABLET | Freq: Every day | ORAL | 1 refills | Status: DC
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
## 2019-11-16 NOTE — Patient Instructions (Signed)
Medication Instructions:  Your physician has recommended you make the following change in your medication:   STOP: Micardis   START: Valsartan 160 mg daily   *If you need a refill on your cardiac medications before your next appointment, please call your pharmacy*   Lab Work: None.  If you have labs (blood work) drawn today and your tests are completely normal, you will receive your results only by: [name] MyChart Message (if you have MyChart) OR . A paper copy in the mail If you have any lab test that is abnormal or we need to change your treatment, we will call you to review the results.   Testing/Procedures: None.    Follow-Up: At [facility], you and your health needs are our priority.  As part of our continuing mission to provide you with exceptional heart care, we have created designated Provider Care Teams.  These Care Teams include your primary Cardiologist (physician) and Advanced Practice Providers (APPs -  Physician Assistants and Nurse Practitioners) who all work together to provide you with the care you need, when you need it.  We recommend signing up for the patient portal called "MyChart".  Sign up information is provided on this After Visit Summary.  MyChart is used to connect with patients for Virtual Visits (Telemedicine).  Patients are able to view lab/test results, encounter notes, upcoming appointments, etc.  Non-urgent messages can be sent to your provider as well.   To learn more about what you can do with MyChart, go to [email or web address].    Your next appointment:   5 month(s)  The format for your next appointment:   In Person  Provider:   Jenne Campus, MD   Other Instructions

## 2019-11-27 ENCOUNTER — Telehealth: Payer: Self-pay | Admitting: Cardiology

## 2019-11-27 MED ORDER — RANOLAZINE ER 500 MG PO TB12: 500.0000 mg | ORAL_TABLET | Freq: Two times a day (BID) | ORAL | 1 refills | Status: DC

## 2019-11-27 NOTE — Telephone Encounter (Signed)
New message      *STAT* If patient is at the pharmacy, call can be transferred to refill team.   1. Which medications need to be refilled? (please list name of each medication and dose if known) ranexa 500 2. Which pharmacy/location (including street and city if local pharmacy) is medication to be sent to? [facility]  3. Do they need a 30 day or 90 day supply? 90 day-------new pharmacy

## 2019-11-27 NOTE — Telephone Encounter (Signed)
Refill sent in per request.  

## 2019-12-10 ENCOUNTER — Other Ambulatory Visit: Payer: Self-pay | Admitting: Cardiology

## 2019-12-14 ENCOUNTER — Other Ambulatory Visit: Payer: Self-pay | Admitting: Cardiology

## 2019-12-22 ENCOUNTER — Other Ambulatory Visit: Payer: Self-pay | Admitting: Cardiology

## 2020-01-12 ENCOUNTER — Other Ambulatory Visit: Payer: Self-pay | Admitting: Cardiology

## 2020-01-14 DIAGNOSIS — M8589 Other specified disorders of bone density and structure, multiple sites: Secondary | ICD-10-CM | POA: Diagnosis not present

## 2020-01-14 DIAGNOSIS — I10 Essential (primary) hypertension: Secondary | ICD-10-CM | POA: Diagnosis not present

## 2020-01-14 DIAGNOSIS — E559 Vitamin D deficiency, unspecified: Secondary | ICD-10-CM | POA: Diagnosis not present

## 2020-01-14 DIAGNOSIS — M1990 Unspecified osteoarthritis, unspecified site: Secondary | ICD-10-CM | POA: Diagnosis not present

## 2020-01-14 DIAGNOSIS — I2510 Atherosclerotic heart disease of native coronary artery without angina pectoris: Secondary | ICD-10-CM | POA: Diagnosis not present

## 2020-01-14 DIAGNOSIS — Z79899 Other long term (current) drug therapy: Secondary | ICD-10-CM | POA: Diagnosis not present

## 2020-01-14 DIAGNOSIS — E785 Hyperlipidemia, unspecified: Secondary | ICD-10-CM | POA: Diagnosis not present

## 2020-01-14 DIAGNOSIS — Z1231 Encounter for screening mammogram for malignant neoplasm of breast: Secondary | ICD-10-CM | POA: Diagnosis not present

## 2020-01-14 DIAGNOSIS — F419 Anxiety disorder, unspecified: Secondary | ICD-10-CM | POA: Diagnosis not present

## 2020-01-14 DIAGNOSIS — J45909 Unspecified asthma, uncomplicated: Secondary | ICD-10-CM | POA: Diagnosis not present

## 2020-01-14 DIAGNOSIS — Z9861 Coronary angioplasty status: Secondary | ICD-10-CM | POA: Diagnosis not present

## 2020-02-14 DIAGNOSIS — Z1231 Encounter for screening mammogram for malignant neoplasm of breast: Secondary | ICD-10-CM | POA: Diagnosis not present

## 2020-02-25 DIAGNOSIS — Z23 Encounter for immunization: Secondary | ICD-10-CM | POA: Diagnosis not present

## 2020-03-19 DIAGNOSIS — L814 Other melanin hyperpigmentation: Secondary | ICD-10-CM | POA: Diagnosis not present

## 2020-03-19 DIAGNOSIS — D225 Melanocytic nevi of trunk: Secondary | ICD-10-CM | POA: Diagnosis not present

## 2020-03-19 DIAGNOSIS — L570 Actinic keratosis: Secondary | ICD-10-CM | POA: Diagnosis not present

## 2020-03-19 DIAGNOSIS — L821 Other seborrheic keratosis: Secondary | ICD-10-CM | POA: Diagnosis not present

## 2020-03-19 DIAGNOSIS — D1801 Hemangioma of skin and subcutaneous tissue: Secondary | ICD-10-CM | POA: Diagnosis not present

## 2020-04-17 ENCOUNTER — Ambulatory Visit: Payer: Medicare Other | Admitting: Cardiology

## 2020-05-15 DIAGNOSIS — D649 Anemia, unspecified: Secondary | ICD-10-CM | POA: Insufficient documentation

## 2020-05-15 DIAGNOSIS — K219 Gastro-esophageal reflux disease without esophagitis: Secondary | ICD-10-CM | POA: Insufficient documentation

## 2020-05-15 DIAGNOSIS — M1990 Unspecified osteoarthritis, unspecified site: Secondary | ICD-10-CM | POA: Insufficient documentation

## 2020-05-15 DIAGNOSIS — I10 Essential (primary) hypertension: Secondary | ICD-10-CM | POA: Insufficient documentation

## 2020-05-15 DIAGNOSIS — E782 Mixed hyperlipidemia: Secondary | ICD-10-CM | POA: Insufficient documentation

## 2020-05-15 DIAGNOSIS — I209 Angina pectoris, unspecified: Secondary | ICD-10-CM | POA: Insufficient documentation

## 2020-05-15 DIAGNOSIS — I2510 Atherosclerotic heart disease of native coronary artery without angina pectoris: Secondary | ICD-10-CM | POA: Insufficient documentation

## 2020-05-15 DIAGNOSIS — I499 Cardiac arrhythmia, unspecified: Secondary | ICD-10-CM | POA: Insufficient documentation

## 2020-05-15 DIAGNOSIS — F99 Mental disorder, not otherwise specified: Secondary | ICD-10-CM | POA: Insufficient documentation

## 2020-05-15 DIAGNOSIS — J45909 Unspecified asthma, uncomplicated: Secondary | ICD-10-CM | POA: Insufficient documentation

## 2020-05-15 DIAGNOSIS — E785 Hyperlipidemia, unspecified: Secondary | ICD-10-CM | POA: Insufficient documentation

## 2020-05-20 ENCOUNTER — Ambulatory Visit (INDEPENDENT_AMBULATORY_CARE_PROVIDER_SITE_OTHER): Payer: Medicare Other | Admitting: Cardiology

## 2020-05-20 ENCOUNTER — Encounter: Payer: Self-pay | Admitting: Cardiology

## 2020-05-20 ENCOUNTER — Other Ambulatory Visit: Payer: Self-pay

## 2020-05-20 VITALS — BP 160/80 | HR 71 | Ht 67.5 in | Wt 182.0 lb

## 2020-05-20 DIAGNOSIS — I472 Ventricular tachycardia: Secondary | ICD-10-CM

## 2020-05-20 DIAGNOSIS — E785 Hyperlipidemia, unspecified: Secondary | ICD-10-CM | POA: Diagnosis not present

## 2020-05-20 DIAGNOSIS — I2510 Atherosclerotic heart disease of native coronary artery without angina pectoris: Secondary | ICD-10-CM | POA: Diagnosis not present

## 2020-05-20 DIAGNOSIS — I4729 Other ventricular tachycardia: Secondary | ICD-10-CM

## 2020-05-20 DIAGNOSIS — I10 Essential (primary) hypertension: Secondary | ICD-10-CM

## 2020-05-20 DIAGNOSIS — R0789 Other chest pain: Secondary | ICD-10-CM
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
## 2020-05-20 NOTE — Patient Instructions (Signed)

## 2020-05-21 LAB — BASIC METABOLIC PANEL
BUN/Creatinine Ratio: 15 (ref 12–28)
BUN: 19 mg/dL (ref 8–27)
CO2: 25 mmol/L (ref 20–29)
Calcium: 9.4 mg/dL (ref 8.7–10.3)
Chloride: 102 mmol/L (ref 96–106)
Creatinine, Ser: 1.28 mg/dL — ABNORMAL HIGH (ref 0.57–1.00)
GFR calc Af Amer: 49 mL/min/{1.73_m2} — ABNORMAL LOW (ref >59)
GFR calc non Af Amer: 42 mL/min/{1.73_m2} — ABNORMAL LOW (ref >59)
Glucose: 82 mg/dL (ref 65–99)
Potassium: 4.4 mmol/L (ref 3.5–5.2)
Sodium: 141 mmol/L (ref 134–144)

## 2020-05-22 ENCOUNTER — Telehealth: Payer: Self-pay | Admitting: Emergency Medicine

## 2020-05-22 MED ORDER — AMLODIPINE BESYLATE 5 MG PO TABS: 5.0000 mg | ORAL_TABLET | Freq: Every day | ORAL | 1 refills | Status: DC

## 2020-05-22 NOTE — Telephone Encounter (Signed)
Called patient informed her of labs and to start amlodipine 5 mg daily. No further questions.

## 2020-05-22 NOTE — Telephone Encounter (Signed)
-----   Message from Georgeanna Lea, MD sent at 05/21/2020  2:09 PM EST ----- Labs show some kidney dysfunction the only number I have compared this with is from 7 years ago which was normal.  I recommend adding amlodipine 5 mg daily to her medical regimen.  Continue rest

## 2020-06-24 ENCOUNTER — Other Ambulatory Visit: Payer: Self-pay

## 2020-06-24 MED ORDER — RANOLAZINE ER 500 MG PO TB12: 500.0000 mg | ORAL_TABLET | Freq: Two times a day (BID) | ORAL | 2 refills | Status: DC

## 2020-06-24 NOTE — Telephone Encounter (Signed)
Rx refill sent to pharmacy. 

## 2020-06-25 DIAGNOSIS — Z20822 Contact with and (suspected) exposure to covid-19: Secondary | ICD-10-CM | POA: Diagnosis not present

## 2020-06-27 DIAGNOSIS — J208 Acute bronchitis due to other specified organisms: Secondary | ICD-10-CM | POA: Diagnosis not present

## 2020-06-27 DIAGNOSIS — B9689 Other specified bacterial agents as the cause of diseases classified elsewhere: Secondary | ICD-10-CM | POA: Diagnosis not present

## 2020-07-07 DIAGNOSIS — U071 COVID-19: Secondary | ICD-10-CM | POA: Diagnosis not present

## 2020-07-08 DIAGNOSIS — U071 COVID-19: Secondary | ICD-10-CM | POA: Diagnosis not present

## 2020-07-15 DIAGNOSIS — I2510 Atherosclerotic heart disease of native coronary artery without angina pectoris: Secondary | ICD-10-CM | POA: Diagnosis not present

## 2020-07-15 DIAGNOSIS — M8589 Other specified disorders of bone density and structure, multiple sites: Secondary | ICD-10-CM | POA: Diagnosis not present

## 2020-07-15 DIAGNOSIS — I10 Essential (primary) hypertension: Secondary | ICD-10-CM | POA: Diagnosis not present

## 2020-07-15 DIAGNOSIS — E785 Hyperlipidemia, unspecified: Secondary | ICD-10-CM | POA: Diagnosis not present

## 2020-07-15 DIAGNOSIS — Z9861 Coronary angioplasty status: Secondary | ICD-10-CM | POA: Diagnosis not present

## 2020-07-15 DIAGNOSIS — Z79899 Other long term (current) drug therapy: Secondary | ICD-10-CM | POA: Diagnosis not present

## 2020-07-15 DIAGNOSIS — J45909 Unspecified asthma, uncomplicated: Secondary | ICD-10-CM | POA: Diagnosis not present

## 2020-07-15 DIAGNOSIS — E559 Vitamin D deficiency, unspecified: Secondary | ICD-10-CM | POA: Diagnosis not present

## 2020-07-15 DIAGNOSIS — M1990 Unspecified osteoarthritis, unspecified site: Secondary | ICD-10-CM | POA: Diagnosis not present

## 2020-07-15 DIAGNOSIS — F419 Anxiety disorder, unspecified: Secondary | ICD-10-CM | POA: Diagnosis not present

## 2020-07-25 DIAGNOSIS — M810 Age-related osteoporosis without current pathological fracture: Secondary | ICD-10-CM | POA: Diagnosis not present

## 2020-07-25 DIAGNOSIS — M8589 Other specified disorders of bone density and structure, multiple sites: Secondary | ICD-10-CM | POA: Diagnosis not present

## 2020-08-19 ENCOUNTER — Ambulatory Visit: Payer: Medicare Other | Admitting: Cardiology

## 2020-08-26 ENCOUNTER — Other Ambulatory Visit: Payer: Self-pay

## 2020-08-26 ENCOUNTER — Encounter: Payer: Self-pay | Admitting: Cardiology

## 2020-08-26 ENCOUNTER — Ambulatory Visit (INDEPENDENT_AMBULATORY_CARE_PROVIDER_SITE_OTHER): Payer: Medicare Other | Admitting: Cardiology

## 2020-08-26 VITALS — BP 110/70 | HR 70 | Ht 67.5 in | Wt 182.0 lb

## 2020-08-26 DIAGNOSIS — E785 Hyperlipidemia, unspecified: Secondary | ICD-10-CM

## 2020-08-26 DIAGNOSIS — I2510 Atherosclerotic heart disease of native coronary artery without angina pectoris: Secondary | ICD-10-CM | POA: Diagnosis not present

## 2020-08-26 DIAGNOSIS — R0789 Other chest pain: Secondary | ICD-10-CM

## 2020-08-26 MED ORDER — AMLODIPINE BESYLATE 2.5 MG PO TABS: 2.5000 mg | ORAL_TABLET | Freq: Every day | ORAL | 1 refills | Status: DC

## 2020-08-26 MED ORDER — EZETIMIBE 10 MG PO TABS: 10.0000 mg | ORAL_TABLET | Freq: Every day | ORAL | 1 refills | Status: DC
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
## 2020-08-26 NOTE — Addendum Note (Signed)
Addended by: Senaida Ores on: 08/26/2020 04:16 PM   Modules accepted: Orders

## 2020-08-26 NOTE — Patient Instructions (Signed)
Medication Instructions:  Your physician has recommended you make the following change in your medication:   DECREASE: Amlodipine 2.5 mg daily   START: Zetia 10 mg daily  *If you need a refill on your cardiac medications before your next appointment, please call your pharmacy*   Lab Work: Your physician recommends that you return for lab work in 6 weeks: lipid   If you have labs (blood work) drawn today and your tests are completely normal, you will receive your results only by: [name] MyChart Message (if you have MyChart) OR . A paper copy in the mail If you have any lab test that is abnormal or we need to change your treatment, we will call you to review the results.   Testing/Procedures: None   Follow-Up: At [facility], you and your health needs are our priority.  As part of our continuing mission to provide you with exceptional heart care, we have created designated Provider Care Teams.  These Care Teams include your primary Cardiologist (physician) and Advanced Practice Providers (APPs -  Physician Assistants and Nurse Practitioners) who all work together to provide you with the care you need, when you need it.  We recommend signing up for the patient portal called "MyChart".  Sign up information is provided on this After Visit Summary.  MyChart is used to connect with patients for Virtual Visits (Telemedicine).  Patients are able to view lab/test results, encounter notes, upcoming appointments, etc.  Non-urgent messages can be sent to your provider as well.   To learn more about what you can do with MyChart, go to [email or web address].    Your next appointment:   6 month(s)  The format for your next appointment:   In Person  Provider:   Jenne Campus, MD   Other Instructions Ezetimibe Tablets What is this medicine? EZETIMIBE (ez ET i mibe) treats high cholesterol. Ezetimibe blocks the absorption of cholesterol from the stomach. It is used with lifestyle changes,  like diet and exercise. It may be used alone or with other medicines. This medicine may be used for other purposes; ask your health care provider or pharmacist if you have questions. COMMON BRAND NAME(S): Zetia What should I tell my health care provider before I take this medicine? They need to know if you have any of these conditions:  kidney disease  liver disease  muscle cramps, pain  muscle injury  thyroid disease  an unusual or allergic reaction to ezetimibe, other medicines, foods, dyes, or preservatives  pregnant or trying to get pregnant  breast-feeding How should I use this medicine? Take this medicine by mouth. Take it as directed on the prescription label at the same time every day. You can take it with or without food. If it upsets your stomach, take it with food. Keep taking it unless your health care provider tells you to stop. Take bile acid sequestrants at a different time of day than this medicine. Take this medicine 2 hours BEFORE or 4 hours AFTER bile acid sequestrants. Talk to your health care provider about the use of this medicine in children. While it may be prescribed for children as young as 10 for selected conditions, precautions do apply. Overdosage: If you think you have taken too much of this medicine contact a poison control center or emergency room at once. NOTE: This medicine is only for you. Do not share this medicine with others. What if I miss a dose? If you miss a dose, take it as soon as you  can. If it is almost time for your next dose, take only that dose. Do not take double or extra doses. What may interact with this medicine? Do not take this medicine with any of the following medications:  fenofibrate  gemfibrozil This medicine may also interact with the following medications:  antacids  cyclosporine  herbal medicines like red yeast rice  other medicines to lower cholesterol or triglycerides This list may not describe all possible  interactions. Give your health care provider a list of all the medicines, herbs, non-prescription drugs, or dietary supplements you use. Also tell them if you smoke, drink alcohol, or use illegal drugs. Some items may interact with your medicine. What should I watch for while using this medicine? Visit your health care provider for regular checks on your progress. Tell your health care provider if your symptoms do not start to get better or if they get worse. Your health care provider may tell you to stop taking this medicine if you develop muscle problems. If your muscle problems do not go away after stopping this medicine, contact your health care provider. Do not become pregnant while taking this medicine. Women should inform their health care provider if they wish to become pregnant or think they might be pregnant. There is potential for serious harm to an unborn child. Talk to your health care provider for more information. Do not breast-feed an infant while taking this medicine. Taking this medicine is only part of a total heart healthy program. Your health care provider may give you a special diet to follow. Avoid alcohol. Avoid smoking. Ask your health care provider how much you should exercise. What side effects may I notice from receiving this medicine? Side effects that you should report to your doctor or health care provider as soon as possible:  allergic reactions (skin rash, itching or hives; swelling of the face, lips, or tongue)  liver injury (dark yellow or brown urine; general ill feeling or flu-like symptoms; loss of appetite, right upper belly pain; unusually weak or tired, yellowing of the eyes or skin)  muscle injury (dark urine; trouble passing urine or change in the amount of urine; unusually weak or tired; muscle pain; back pain) Side effects that usually do not require medical attention (report to your doctor or health care provider if they continue or are  bothersome):  depressed mood  diarrhea  headache  infection (fever, chills, cough, sore throat, pain, or trouble passing urine)  joint pain  stomach pain This list may not describe all possible side effects. Call your doctor for medical advice about side effects. You may report side effects to FDA at 1-800-FDA-1088. Where should I keep my medicine? Keep out of the reach of children and pets. Store at room temperature between 15 and 30 degrees C (59 and 86 degrees F). Protect from moisture. Get rid of any unused medicine after the expiration date. NOTE: This sheet is a summary. It may not cover all possible information. If you have questions about this medicine, talk to your doctor, pharmacist, or health care provider.  2021 Elsevier/Gold Standard (2019-04-11 17:28:51)

## 2020-10-02 DIAGNOSIS — E785 Hyperlipidemia, unspecified: Secondary | ICD-10-CM | POA: Diagnosis not present

## 2020-10-02 DIAGNOSIS — Z9181 History of falling: Secondary | ICD-10-CM | POA: Diagnosis not present

## 2020-10-02 DIAGNOSIS — Z0000 Encounter for general adult medical examination without abnormal findings: Secondary | ICD-10-CM | POA: Diagnosis not present

## 2020-10-02 DIAGNOSIS — Z139 Encounter for screening, unspecified: Secondary | ICD-10-CM | POA: Diagnosis not present

## 2020-10-02 DIAGNOSIS — Z1331 Encounter for screening for depression: Secondary | ICD-10-CM | POA: Diagnosis not present

## 2020-10-07 DIAGNOSIS — R0789 Other chest pain: Secondary | ICD-10-CM | POA: Diagnosis not present

## 2020-10-07 DIAGNOSIS — I2510 Atherosclerotic heart disease of native coronary artery without angina pectoris: Secondary | ICD-10-CM | POA: Diagnosis not present

## 2020-10-07 DIAGNOSIS — E785 Hyperlipidemia, unspecified: Secondary | ICD-10-CM | POA: Diagnosis not present

## 2020-10-07 LAB — LIPID PANEL
Chol/HDL Ratio: 2.7 ratio (ref 0.0–4.4)
Cholesterol, Total: 186 mg/dL (ref 100–199)
HDL: 69 mg/dL (ref >39)
LDL Chol Calc (NIH): 103 mg/dL — ABNORMAL HIGH (ref 0–99)
Triglycerides: 74 mg/dL (ref 0–149)
VLDL Cholesterol Cal: 14 mg/dL (ref 5–40)

## 2020-10-09 ENCOUNTER — Telehealth: Payer: Self-pay | Admitting: Emergency Medicine

## 2020-10-09 DIAGNOSIS — E785 Hyperlipidemia, unspecified: Secondary | ICD-10-CM

## 2020-10-09 NOTE — Telephone Encounter (Signed)
-----   Message from Park Liter, MD sent at 10/09/2020  1:38 PM EDT ----- Cholesterol still elevated LDL is 103.  I advised to continue present management and lets recheck her cholesterol in about 3 months

## 2021-01-09 DIAGNOSIS — E785 Hyperlipidemia, unspecified: Secondary | ICD-10-CM | POA: Diagnosis not present

## 2021-01-09 LAB — LIPID PANEL
Chol/HDL Ratio: 3.2 ratio (ref 0.0–4.4)
Cholesterol, Total: 198 mg/dL (ref 100–199)
HDL: 62 mg/dL (ref >39)
LDL Chol Calc (NIH): 116 mg/dL — ABNORMAL HIGH (ref 0–99)
Triglycerides: 114 mg/dL (ref 0–149)
VLDL Cholesterol Cal: 20 mg/dL (ref 5–40)

## 2021-01-14 ENCOUNTER — Telehealth: Payer: Self-pay

## 2021-01-14 DIAGNOSIS — E785 Hyperlipidemia, unspecified: Secondary | ICD-10-CM

## 2021-01-14 NOTE — Telephone Encounter (Signed)
-----   Message from Park Liter, MD sent at 01/14/2021 10:29 AM EDT ----- So lets refer her to lipid clinic. ----- Message ----- From: Gita Kudo, RN Sent: 01/13/2021   2:03 PM EDT To: Park Liter, MD  I do not see that she has ever been seen by the lipid clinic.  ----- Message ----- From: Park Liter, MD Sent: 01/13/2021   1:55 PM EDT To: Gita Kudo, RN  Cholesterol better but still not perfect.  If she is followed by lipid clinic she is to follow-up with them for it.

## 2021-01-14 NOTE — Telephone Encounter (Signed)
Spoke with patient regarding results and recommendation.  Patient verbalizes understanding and is agreeable to plan of care. Advised patient to call back with any issues or concerns.  

## 2021-01-20 DIAGNOSIS — Z1231 Encounter for screening mammogram for malignant neoplasm of breast: Secondary | ICD-10-CM | POA: Diagnosis not present

## 2021-01-20 DIAGNOSIS — F419 Anxiety disorder, unspecified: Secondary | ICD-10-CM | POA: Diagnosis not present

## 2021-01-20 DIAGNOSIS — M810 Age-related osteoporosis without current pathological fracture: Secondary | ICD-10-CM | POA: Diagnosis not present

## 2021-01-20 DIAGNOSIS — E559 Vitamin D deficiency, unspecified: Secondary | ICD-10-CM | POA: Diagnosis not present

## 2021-01-20 DIAGNOSIS — Z79899 Other long term (current) drug therapy: Secondary | ICD-10-CM | POA: Diagnosis not present

## 2021-01-20 DIAGNOSIS — I2510 Atherosclerotic heart disease of native coronary artery without angina pectoris: Secondary | ICD-10-CM | POA: Diagnosis not present

## 2021-01-20 DIAGNOSIS — I129 Hypertensive chronic kidney disease with stage 1 through stage 4 chronic kidney disease, or unspecified chronic kidney disease: Secondary | ICD-10-CM | POA: Diagnosis not present

## 2021-01-20 DIAGNOSIS — J45909 Unspecified asthma, uncomplicated: Secondary | ICD-10-CM | POA: Diagnosis not present

## 2021-01-20 DIAGNOSIS — M1990 Unspecified osteoarthritis, unspecified site: Secondary | ICD-10-CM | POA: Diagnosis not present

## 2021-01-20 DIAGNOSIS — E785 Hyperlipidemia, unspecified: Secondary | ICD-10-CM | POA: Diagnosis not present

## 2021-01-20 DIAGNOSIS — Z9861 Coronary angioplasty status: Secondary | ICD-10-CM | POA: Diagnosis not present

## 2021-01-22 ENCOUNTER — Telehealth: Payer: Self-pay

## 2021-01-22 DIAGNOSIS — E785 Hyperlipidemia, unspecified: Secondary | ICD-10-CM

## 2021-01-22 NOTE — Telephone Encounter (Signed)
Called and spoke with pt and asked if they were currently on praluent and they stated they stopped it. They stated that they were instructed to stop praluent '75mg'$  by dr. Agustin Cree. I believe the pt misheard that he actually wanted them to take the praluent '75mg'$  q14d and the zetia '10mg'$  qd. The pt voiced understanding to resume and we will have her recheck labs in November. I believe that this is what our lipid clinic would have done anyway at the upcoming appt (I cancelled). I will route to kristin alvstad to have her document whether they agree or disagree. The pt stated that they currently had some praluent '75mg'$  in their fridge. I also didn't see in his 08/26/20 office visit where he stated to stop praluent so left me thinking he intended on her doing both.

## 2021-01-22 NOTE — Telephone Encounter (Signed)
-----   Message from Rockne Menghini, [place] sent at 01/22/2021  8:43 AM EDT ----- Regarding: FW: APPT NEEDED? Call patient and ask if she is taking the Praluent, and if so, how long?  If on, she should repeat labs in mid-October.  Thank you! K ----- Message ----- From: Allean Found, CMA Sent: 01/21/2021   5:46 PM EDT To: Rockne Menghini, RPH-CPP Subject: APPT NEEDED?                                   Looks like pt coming to see ya for lipids but she already is on p75. Unsure if appt is needed?

## 2021-01-22 NOTE — Telephone Encounter (Signed)
Patient should continue with Praluent and ezetimibe both.   Repeat lipid and liver labs after 2 months on both.

## 2021-02-03 ENCOUNTER — Ambulatory Visit: Payer: Medicare Other

## 2021-02-10 ENCOUNTER — Telehealth: Payer: Self-pay

## 2021-02-10 DIAGNOSIS — M25561 Pain in right knee: Secondary | ICD-10-CM | POA: Diagnosis not present

## 2021-02-10 DIAGNOSIS — I2510 Atherosclerotic heart disease of native coronary artery without angina pectoris: Secondary | ICD-10-CM | POA: Diagnosis not present

## 2021-02-10 DIAGNOSIS — I129 Hypertensive chronic kidney disease with stage 1 through stage 4 chronic kidney disease, or unspecified chronic kidney disease: Secondary | ICD-10-CM | POA: Diagnosis not present

## 2021-02-10 DIAGNOSIS — Z9861 Coronary angioplasty status: Secondary | ICD-10-CM | POA: Diagnosis not present

## 2021-02-10 DIAGNOSIS — J45909 Unspecified asthma, uncomplicated: Secondary | ICD-10-CM | POA: Diagnosis not present

## 2021-02-10 DIAGNOSIS — Z01818 Encounter for other preprocedural examination: Secondary | ICD-10-CM | POA: Diagnosis not present

## 2021-02-10 DIAGNOSIS — Z23 Encounter for immunization: Secondary | ICD-10-CM | POA: Diagnosis not present

## 2021-02-10 DIAGNOSIS — M1711 Unilateral primary osteoarthritis, right knee: Secondary | ICD-10-CM | POA: Diagnosis not present

## 2021-02-10 DIAGNOSIS — Z96652 Presence of left artificial knee joint: Secondary | ICD-10-CM | POA: Diagnosis not present

## 2021-02-10 NOTE — Telephone Encounter (Signed)
   [facility] Pre-operative Risk Assessment    Request for surgical clearance:  What type of surgery is being performed? TKA   When is this surgery scheduled? TBD   What type of clearance is required (medical clearance vs. Pharmacy clearance to hold med vs. Both)? Both  Are there any medications that need to be held prior to surgery and how long?Not specified however the patient is on Aspirin 81   Practice name and name of physician performing surgery? [facility]. L[facility] Mulch [facility]   What is your office phone number: : [phone]    7.   What is your office fax number: [phone]  8.   Anesthesia type (None, local, MAC, general) ? Not specified   Emily Ayers 02/10/2021, 4:25 PM  _________________________________________________________________   (provider comments below)

## 2021-02-11 NOTE — Telephone Encounter (Signed)
   Name: Emily Ayers  DOB: 06-Oct-1950  MRN: [id number]   Primary Cardiologist: None  Chart reviewed as part of pre-operative protocol coverage. Patient was contacted 02/11/2021 in reference to pre-operative risk assessment for pending surgery as outlined below.  Emily Ayers was last seen on 08/26/2020 by Dr. Agustin Cree.  Since that day, Emily Ayers has done well from a cardiac standpoint. Despite her knee trouble she is still walking 3 miles a day.  She can easily complete 4 METS without anginal complaints.  Therefore, based on ACC/AHA guidelines, the patient would be at acceptable risk for the planned procedure without further cardiovascular testing.   Per Dr. Agustin Cree, patient can hold aspirin 5 to 7 days prior to her upcoming procedure with plans to restart as soon as she is cleared to do so by her surgeon.  The patient was advised that if she develops new symptoms prior to surgery to contact our office to arrange for a follow-up visit, and she verbalized understanding.  I will route this recommendation to the requesting party via Epic fax function and remove from pre-op pool. Please call with questions.  Abigail Butts, PA-C 02/11/2021, 11:46 AM

## 2021-02-16 DIAGNOSIS — Z1231 Encounter for screening mammogram for malignant neoplasm of breast: Secondary | ICD-10-CM | POA: Diagnosis not present

## 2021-02-18 ENCOUNTER — Other Ambulatory Visit: Payer: Self-pay | Admitting: Cardiology

## 2021-02-18 NOTE — Telephone Encounter (Signed)
Ezetimibe 10 mg # 90 tablet only with message for patient needs appointment for future refills sent to   [place], [place]

## 2021-03-05 ENCOUNTER — Other Ambulatory Visit: Payer: Self-pay | Admitting: Cardiology

## 2021-03-24 DIAGNOSIS — Z01812 Encounter for preprocedural laboratory examination: Secondary | ICD-10-CM | POA: Diagnosis not present

## 2021-03-24 DIAGNOSIS — I10 Essential (primary) hypertension: Secondary | ICD-10-CM | POA: Diagnosis not present

## 2021-04-27 DIAGNOSIS — M25761 Osteophyte, right knee: Secondary | ICD-10-CM | POA: Diagnosis not present

## 2021-04-27 DIAGNOSIS — G8918 Other acute postprocedural pain: Secondary | ICD-10-CM | POA: Diagnosis not present

## 2021-04-27 DIAGNOSIS — M1711 Unilateral primary osteoarthritis, right knee: Secondary | ICD-10-CM | POA: Diagnosis not present

## 2021-04-27 HISTORY — PX: REVISION TOTAL KNEE ARTHROPLASTY: SHX767

## 2021-05-04 DIAGNOSIS — R2689 Other abnormalities of gait and mobility: Secondary | ICD-10-CM | POA: Diagnosis not present

## 2021-05-04 DIAGNOSIS — Z96651 Presence of right artificial knee joint: Secondary | ICD-10-CM | POA: Diagnosis not present

## 2021-05-04 DIAGNOSIS — M25561 Pain in right knee: Secondary | ICD-10-CM | POA: Diagnosis not present

## 2021-05-04 DIAGNOSIS — M25461 Effusion, right knee: Secondary | ICD-10-CM | POA: Diagnosis not present

## 2021-05-04 DIAGNOSIS — Z4733 Aftercare following explantation of knee joint prosthesis: Secondary | ICD-10-CM | POA: Diagnosis not present

## 2021-05-04 DIAGNOSIS — M62551 Muscle wasting and atrophy, not elsewhere classified, right thigh: Secondary | ICD-10-CM | POA: Diagnosis not present

## 2021-05-06 DIAGNOSIS — M62551 Muscle wasting and atrophy, not elsewhere classified, right thigh: Secondary | ICD-10-CM | POA: Diagnosis not present

## 2021-05-06 DIAGNOSIS — R2689 Other abnormalities of gait and mobility: Secondary | ICD-10-CM | POA: Diagnosis not present

## 2021-05-06 DIAGNOSIS — Z4733 Aftercare following explantation of knee joint prosthesis: Secondary | ICD-10-CM | POA: Diagnosis not present

## 2021-05-06 DIAGNOSIS — M25561 Pain in right knee: Secondary | ICD-10-CM | POA: Diagnosis not present

## 2021-05-06 DIAGNOSIS — Z96651 Presence of right artificial knee joint: Secondary | ICD-10-CM | POA: Diagnosis not present

## 2021-05-06 DIAGNOSIS — M25461 Effusion, right knee: Secondary | ICD-10-CM | POA: Diagnosis not present

## 2021-05-07 DIAGNOSIS — Z96651 Presence of right artificial knee joint: Secondary | ICD-10-CM

## 2021-05-07 HISTORY — DX: Presence of right artificial knee joint: Z96.651

## 2021-05-08 DIAGNOSIS — M62551 Muscle wasting and atrophy, not elsewhere classified, right thigh: Secondary | ICD-10-CM | POA: Diagnosis not present

## 2021-05-08 DIAGNOSIS — M25561 Pain in right knee: Secondary | ICD-10-CM | POA: Diagnosis not present

## 2021-05-08 DIAGNOSIS — R2689 Other abnormalities of gait and mobility: Secondary | ICD-10-CM | POA: Diagnosis not present

## 2021-05-08 DIAGNOSIS — Z4733 Aftercare following explantation of knee joint prosthesis: Secondary | ICD-10-CM | POA: Diagnosis not present

## 2021-05-08 DIAGNOSIS — Z96651 Presence of right artificial knee joint: Secondary | ICD-10-CM | POA: Diagnosis not present

## 2021-05-08 DIAGNOSIS — M25461 Effusion, right knee: Secondary | ICD-10-CM | POA: Diagnosis not present

## 2021-05-13 DIAGNOSIS — R2689 Other abnormalities of gait and mobility: Secondary | ICD-10-CM | POA: Diagnosis not present

## 2021-05-13 DIAGNOSIS — Z96651 Presence of right artificial knee joint: Secondary | ICD-10-CM | POA: Diagnosis not present

## 2021-05-13 DIAGNOSIS — Z4733 Aftercare following explantation of knee joint prosthesis: Secondary | ICD-10-CM | POA: Diagnosis not present

## 2021-05-13 DIAGNOSIS — M25461 Effusion, right knee: Secondary | ICD-10-CM | POA: Diagnosis not present

## 2021-05-13 DIAGNOSIS — M62551 Muscle wasting and atrophy, not elsewhere classified, right thigh: Secondary | ICD-10-CM | POA: Diagnosis not present

## 2021-05-13 DIAGNOSIS — M25561 Pain in right knee: Secondary | ICD-10-CM | POA: Diagnosis not present

## 2021-05-15 ENCOUNTER — Other Ambulatory Visit: Payer: Self-pay | Admitting: Cardiology

## 2021-05-15 DIAGNOSIS — Z4733 Aftercare following explantation of knee joint prosthesis: Secondary | ICD-10-CM | POA: Diagnosis not present

## 2021-05-15 DIAGNOSIS — M25461 Effusion, right knee: Secondary | ICD-10-CM | POA: Diagnosis not present

## 2021-05-15 DIAGNOSIS — M62551 Muscle wasting and atrophy, not elsewhere classified, right thigh: Secondary | ICD-10-CM | POA: Diagnosis not present

## 2021-05-15 DIAGNOSIS — M25561 Pain in right knee: Secondary | ICD-10-CM | POA: Diagnosis not present

## 2021-05-15 DIAGNOSIS — R2689 Other abnormalities of gait and mobility: Secondary | ICD-10-CM | POA: Diagnosis not present

## 2021-05-15 DIAGNOSIS — Z96651 Presence of right artificial knee joint: Secondary | ICD-10-CM | POA: Diagnosis not present

## 2021-05-15 NOTE — Telephone Encounter (Signed)
Zetia 10 mg # 90 only with message patient needs appointment for future refills / 1st attempt  sent to  [place] [place]

## 2021-05-20 DIAGNOSIS — M62551 Muscle wasting and atrophy, not elsewhere classified, right thigh: Secondary | ICD-10-CM | POA: Diagnosis not present

## 2021-05-20 DIAGNOSIS — Z4733 Aftercare following explantation of knee joint prosthesis: Secondary | ICD-10-CM | POA: Diagnosis not present

## 2021-05-20 DIAGNOSIS — Z96651 Presence of right artificial knee joint: Secondary | ICD-10-CM | POA: Diagnosis not present

## 2021-05-20 DIAGNOSIS — M25561 Pain in right knee: Secondary | ICD-10-CM | POA: Diagnosis not present

## 2021-05-20 DIAGNOSIS — R2689 Other abnormalities of gait and mobility: Secondary | ICD-10-CM | POA: Diagnosis not present

## 2021-05-20 DIAGNOSIS — M25461 Effusion, right knee: Secondary | ICD-10-CM | POA: Diagnosis not present

## 2021-05-22 DIAGNOSIS — M25561 Pain in right knee: Secondary | ICD-10-CM | POA: Diagnosis not present

## 2021-05-22 DIAGNOSIS — M25461 Effusion, right knee: Secondary | ICD-10-CM | POA: Diagnosis not present

## 2021-05-22 DIAGNOSIS — Z4733 Aftercare following explantation of knee joint prosthesis: Secondary | ICD-10-CM | POA: Diagnosis not present

## 2021-05-22 DIAGNOSIS — Z96651 Presence of right artificial knee joint: Secondary | ICD-10-CM | POA: Diagnosis not present

## 2021-05-22 DIAGNOSIS — R2689 Other abnormalities of gait and mobility: Secondary | ICD-10-CM | POA: Diagnosis not present

## 2021-05-22 DIAGNOSIS — M62551 Muscle wasting and atrophy, not elsewhere classified, right thigh: Secondary | ICD-10-CM | POA: Diagnosis not present

## 2021-05-25 DIAGNOSIS — Z4733 Aftercare following explantation of knee joint prosthesis: Secondary | ICD-10-CM | POA: Diagnosis not present

## 2021-05-25 DIAGNOSIS — R2689 Other abnormalities of gait and mobility: Secondary | ICD-10-CM | POA: Diagnosis not present

## 2021-05-25 DIAGNOSIS — M25461 Effusion, right knee: Secondary | ICD-10-CM | POA: Diagnosis not present

## 2021-05-25 DIAGNOSIS — M25561 Pain in right knee: Secondary | ICD-10-CM | POA: Diagnosis not present

## 2021-05-25 DIAGNOSIS — M62551 Muscle wasting and atrophy, not elsewhere classified, right thigh: Secondary | ICD-10-CM | POA: Diagnosis not present

## 2021-05-25 DIAGNOSIS — Z96651 Presence of right artificial knee joint: Secondary | ICD-10-CM | POA: Diagnosis not present

## 2021-05-29 DIAGNOSIS — M25561 Pain in right knee: Secondary | ICD-10-CM | POA: Diagnosis not present

## 2021-05-29 DIAGNOSIS — M25461 Effusion, right knee: Secondary | ICD-10-CM | POA: Diagnosis not present

## 2021-05-29 DIAGNOSIS — Z96651 Presence of right artificial knee joint: Secondary | ICD-10-CM | POA: Diagnosis not present

## 2021-05-29 DIAGNOSIS — M62551 Muscle wasting and atrophy, not elsewhere classified, right thigh: Secondary | ICD-10-CM | POA: Diagnosis not present

## 2021-05-29 DIAGNOSIS — R2689 Other abnormalities of gait and mobility: Secondary | ICD-10-CM | POA: Diagnosis not present

## 2021-05-29 DIAGNOSIS — Z4733 Aftercare following explantation of knee joint prosthesis: Secondary | ICD-10-CM | POA: Diagnosis not present

## 2021-06-01 DIAGNOSIS — Z96651 Presence of right artificial knee joint: Secondary | ICD-10-CM | POA: Diagnosis not present

## 2021-06-01 DIAGNOSIS — R2689 Other abnormalities of gait and mobility: Secondary | ICD-10-CM | POA: Diagnosis not present

## 2021-06-01 DIAGNOSIS — M25461 Effusion, right knee: Secondary | ICD-10-CM | POA: Diagnosis not present

## 2021-06-01 DIAGNOSIS — Z4733 Aftercare following explantation of knee joint prosthesis: Secondary | ICD-10-CM | POA: Diagnosis not present

## 2021-06-01 DIAGNOSIS — M62551 Muscle wasting and atrophy, not elsewhere classified, right thigh: Secondary | ICD-10-CM | POA: Diagnosis not present

## 2021-06-01 DIAGNOSIS — M25561 Pain in right knee: Secondary | ICD-10-CM | POA: Diagnosis not present

## 2021-06-05 DIAGNOSIS — M62551 Muscle wasting and atrophy, not elsewhere classified, right thigh: Secondary | ICD-10-CM | POA: Diagnosis not present

## 2021-06-05 DIAGNOSIS — R2689 Other abnormalities of gait and mobility: Secondary | ICD-10-CM | POA: Diagnosis not present

## 2021-06-05 DIAGNOSIS — M25561 Pain in right knee: Secondary | ICD-10-CM | POA: Diagnosis not present

## 2021-06-05 DIAGNOSIS — M25461 Effusion, right knee: Secondary | ICD-10-CM | POA: Diagnosis not present

## 2021-06-05 DIAGNOSIS — Z96651 Presence of right artificial knee joint: Secondary | ICD-10-CM | POA: Diagnosis not present

## 2021-06-05 DIAGNOSIS — Z4733 Aftercare following explantation of knee joint prosthesis: Secondary | ICD-10-CM | POA: Diagnosis not present

## 2021-06-08 DIAGNOSIS — M25461 Effusion, right knee: Secondary | ICD-10-CM | POA: Diagnosis not present

## 2021-06-08 DIAGNOSIS — Z4733 Aftercare following explantation of knee joint prosthesis: Secondary | ICD-10-CM | POA: Diagnosis not present

## 2021-06-08 DIAGNOSIS — M62551 Muscle wasting and atrophy, not elsewhere classified, right thigh: Secondary | ICD-10-CM | POA: Diagnosis not present

## 2021-06-08 DIAGNOSIS — M25561 Pain in right knee: Secondary | ICD-10-CM | POA: Diagnosis not present

## 2021-06-08 DIAGNOSIS — R2689 Other abnormalities of gait and mobility: Secondary | ICD-10-CM | POA: Diagnosis not present

## 2021-06-08 DIAGNOSIS — Z96651 Presence of right artificial knee joint: Secondary | ICD-10-CM | POA: Diagnosis not present

## 2021-06-12 DIAGNOSIS — M62551 Muscle wasting and atrophy, not elsewhere classified, right thigh: Secondary | ICD-10-CM | POA: Diagnosis not present

## 2021-06-12 DIAGNOSIS — M25561 Pain in right knee: Secondary | ICD-10-CM | POA: Diagnosis not present

## 2021-06-12 DIAGNOSIS — Z4733 Aftercare following explantation of knee joint prosthesis: Secondary | ICD-10-CM | POA: Diagnosis not present

## 2021-06-12 DIAGNOSIS — M25461 Effusion, right knee: Secondary | ICD-10-CM | POA: Diagnosis not present

## 2021-06-12 DIAGNOSIS — Z96651 Presence of right artificial knee joint: Secondary | ICD-10-CM | POA: Diagnosis not present

## 2021-06-12 DIAGNOSIS — R2689 Other abnormalities of gait and mobility: Secondary | ICD-10-CM | POA: Diagnosis not present

## 2021-06-16 DIAGNOSIS — M25461 Effusion, right knee: Secondary | ICD-10-CM | POA: Diagnosis not present

## 2021-06-16 DIAGNOSIS — M25561 Pain in right knee: Secondary | ICD-10-CM | POA: Diagnosis not present

## 2021-06-16 DIAGNOSIS — Z96651 Presence of right artificial knee joint: Secondary | ICD-10-CM | POA: Diagnosis not present

## 2021-06-16 DIAGNOSIS — M62551 Muscle wasting and atrophy, not elsewhere classified, right thigh: Secondary | ICD-10-CM | POA: Diagnosis not present

## 2021-06-16 DIAGNOSIS — Z4733 Aftercare following explantation of knee joint prosthesis: Secondary | ICD-10-CM | POA: Diagnosis not present

## 2021-06-16 DIAGNOSIS — R2689 Other abnormalities of gait and mobility: Secondary | ICD-10-CM | POA: Diagnosis not present

## 2021-06-19 DIAGNOSIS — R2689 Other abnormalities of gait and mobility: Secondary | ICD-10-CM | POA: Diagnosis not present

## 2021-06-19 DIAGNOSIS — M62551 Muscle wasting and atrophy, not elsewhere classified, right thigh: Secondary | ICD-10-CM | POA: Diagnosis not present

## 2021-06-19 DIAGNOSIS — Z96651 Presence of right artificial knee joint: Secondary | ICD-10-CM | POA: Diagnosis not present

## 2021-06-19 DIAGNOSIS — M25461 Effusion, right knee: Secondary | ICD-10-CM | POA: Diagnosis not present

## 2021-06-19 DIAGNOSIS — Z4733 Aftercare following explantation of knee joint prosthesis: Secondary | ICD-10-CM | POA: Diagnosis not present

## 2021-06-19 DIAGNOSIS — M25561 Pain in right knee: Secondary | ICD-10-CM | POA: Diagnosis not present

## 2021-07-20 DIAGNOSIS — Z9861 Coronary angioplasty status: Secondary | ICD-10-CM | POA: Diagnosis not present

## 2021-07-20 DIAGNOSIS — I2510 Atherosclerotic heart disease of native coronary artery without angina pectoris: Secondary | ICD-10-CM | POA: Diagnosis not present

## 2021-07-20 DIAGNOSIS — I129 Hypertensive chronic kidney disease with stage 1 through stage 4 chronic kidney disease, or unspecified chronic kidney disease: Secondary | ICD-10-CM | POA: Diagnosis not present

## 2021-07-20 DIAGNOSIS — Z79899 Other long term (current) drug therapy: Secondary | ICD-10-CM | POA: Diagnosis not present

## 2021-07-20 DIAGNOSIS — Z1211 Encounter for screening for malignant neoplasm of colon: Secondary | ICD-10-CM | POA: Diagnosis not present

## 2021-07-20 DIAGNOSIS — M810 Age-related osteoporosis without current pathological fracture: Secondary | ICD-10-CM | POA: Diagnosis not present

## 2021-07-20 DIAGNOSIS — M1990 Unspecified osteoarthritis, unspecified site: Secondary | ICD-10-CM | POA: Diagnosis not present

## 2021-07-20 DIAGNOSIS — E785 Hyperlipidemia, unspecified: Secondary | ICD-10-CM | POA: Diagnosis not present

## 2021-07-20 DIAGNOSIS — F419 Anxiety disorder, unspecified: Secondary | ICD-10-CM | POA: Diagnosis not present

## 2021-07-20 DIAGNOSIS — J45909 Unspecified asthma, uncomplicated: Secondary | ICD-10-CM | POA: Diagnosis not present

## 2021-07-20 DIAGNOSIS — E559 Vitamin D deficiency, unspecified: Secondary | ICD-10-CM | POA: Diagnosis not present

## 2021-07-29 ENCOUNTER — Telehealth: Payer: Self-pay

## 2021-07-29 NOTE — Telephone Encounter (Signed)
Pharmacy needs PA for medication... please advise  ?

## 2021-07-29 NOTE — Telephone Encounter (Signed)
PA SENT TO PLAN FOR PRALUENT '75MG'$  Q2W: ?Emily Ayers (Key: [id number]) - [id number] ?Praluent '75MG'$ /ML auto-injectors ?Status: PA Request ?Created: March 15th, 2023 ?Sent: March 15th, 2023 ?Called to retrieve the insurance information is as follows: ?[employer] ?[id number] ?[id number] ?[id number] ?[id number] ?

## 2021-08-04 DIAGNOSIS — K649 Unspecified hemorrhoids: Secondary | ICD-10-CM | POA: Diagnosis not present

## 2021-08-04 DIAGNOSIS — R1310 Dysphagia, unspecified: Secondary | ICD-10-CM | POA: Diagnosis not present

## 2021-08-04 DIAGNOSIS — R1013 Epigastric pain: Secondary | ICD-10-CM | POA: Diagnosis not present

## 2021-08-06 ENCOUNTER — Telehealth: Payer: Self-pay | Admitting: Cardiology

## 2021-08-06 NOTE — Telephone Encounter (Signed)
Called the patient and she was very concerned about her kidney function and her lab values. She thought that the high lab values might be caused by her cholesterol medication. She asked to have an appointment scheduled with Dr. Agustin Cree so she could discuss it with him. I scheduled an appointment for her with Dr. Agustin Cree on April 10 th at 2:20 pm. Patient had no further questions at this time. ?

## 2021-08-06 NOTE — Telephone Encounter (Signed)
Patient wanted to discuss the labs she had at Dr. Denton Lank office (her PCP). She said that her creatinine was abnormal. Her PCP did not mention this to her but her Gastroenterologist did when she went to discuss a possible upcoming Colonoscopy.  ? ?The patient just wanted to talk to Dr. Agustin Cree or his Nurse  ?

## 2021-08-11 ENCOUNTER — Other Ambulatory Visit: Payer: Self-pay | Admitting: Cardiology

## 2021-08-18 DIAGNOSIS — K222 Esophageal obstruction: Secondary | ICD-10-CM | POA: Diagnosis not present

## 2021-08-18 DIAGNOSIS — D126 Benign neoplasm of colon, unspecified: Secondary | ICD-10-CM | POA: Diagnosis not present

## 2021-08-18 DIAGNOSIS — K317 Polyp of stomach and duodenum: Secondary | ICD-10-CM | POA: Diagnosis not present

## 2021-08-18 DIAGNOSIS — K648 Other hemorrhoids: Secondary | ICD-10-CM | POA: Diagnosis not present

## 2021-08-18 DIAGNOSIS — K2270 Barrett's esophagus without dysplasia: Secondary | ICD-10-CM | POA: Diagnosis not present

## 2021-08-18 DIAGNOSIS — K449 Diaphragmatic hernia without obstruction or gangrene: Secondary | ICD-10-CM | POA: Diagnosis not present

## 2021-08-18 DIAGNOSIS — R1310 Dysphagia, unspecified: Secondary | ICD-10-CM | POA: Diagnosis not present

## 2021-08-18 DIAGNOSIS — Z86010 Personal history of colonic polyps: Secondary | ICD-10-CM | POA: Diagnosis not present

## 2021-08-18 DIAGNOSIS — K219 Gastro-esophageal reflux disease without esophagitis: Secondary | ICD-10-CM | POA: Diagnosis not present

## 2021-08-18 DIAGNOSIS — D122 Benign neoplasm of ascending colon: Secondary | ICD-10-CM | POA: Diagnosis not present

## 2021-08-18 DIAGNOSIS — K5733 Diverticulitis of large intestine without perforation or abscess with bleeding: Secondary | ICD-10-CM | POA: Diagnosis not present

## 2021-08-18 DIAGNOSIS — K5730 Diverticulosis of large intestine without perforation or abscess without bleeding: Secondary | ICD-10-CM | POA: Diagnosis not present

## 2021-08-18 DIAGNOSIS — K644 Residual hemorrhoidal skin tags: Secondary | ICD-10-CM | POA: Diagnosis not present

## 2021-08-18 DIAGNOSIS — I10 Essential (primary) hypertension: Secondary | ICD-10-CM | POA: Diagnosis not present

## 2021-08-18 DIAGNOSIS — K635 Polyp of colon: Secondary | ICD-10-CM | POA: Diagnosis not present

## 2021-08-18 DIAGNOSIS — J45909 Unspecified asthma, uncomplicated: Secondary | ICD-10-CM | POA: Diagnosis not present

## 2021-08-24 ENCOUNTER — Ambulatory Visit (INDEPENDENT_AMBULATORY_CARE_PROVIDER_SITE_OTHER): Payer: Medicare Other | Admitting: Cardiology

## 2021-08-24 ENCOUNTER — Encounter: Payer: Self-pay | Admitting: Cardiology

## 2021-08-24 VITALS — BP 148/82 | HR 64 | Ht 68.0 in | Wt 180.4 lb

## 2021-08-24 DIAGNOSIS — R0789 Other chest pain: Secondary | ICD-10-CM

## 2021-08-24 DIAGNOSIS — E785 Hyperlipidemia, unspecified: Secondary | ICD-10-CM | POA: Diagnosis not present

## 2021-08-24 DIAGNOSIS — R0609 Other forms of dyspnea: Secondary | ICD-10-CM

## 2021-08-24 DIAGNOSIS — I10 Essential (primary) hypertension: Secondary | ICD-10-CM | POA: Diagnosis not present

## 2021-08-24 DIAGNOSIS — I2510 Atherosclerotic heart disease of native coronary artery without angina pectoris: Secondary | ICD-10-CM | POA: Diagnosis not present
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
## 2021-08-24 NOTE — Patient Instructions (Signed)
Medication Instructions:  ?Your physician recommends that you continue on your current medications as directed. Please refer to the Current Medication list given to you today.  ?*If you need a refill on your cardiac medications before your next appointment, please call your pharmacy* ? ? ?Lab Work: ?None Ordered ?If you have labs (blood work) drawn today and your tests are completely normal, you will receive your results only by: ?MyChart Message (if you have MyChart) OR ?A paper copy in the mail ?If you have any lab test that is abnormal or we need to change your treatment, we will call you to review the results. ? ? ?Testing/Procedures: ?Your physician has requested that you have an echocardiogram. Echocardiography is a painless test that uses sound waves to create images of your heart. It provides your doctor with information about the size and shape of your heart and how well your heart?s chambers and valves are working. This procedure takes approximately one hour. There are no restrictions for this procedure.  ? ? ?Follow-Up: ?At [facility], you and your health needs are our priority.  As part of our continuing mission to provide you with exceptional heart care, we have created designated Provider Care Teams.  These Care Teams include your primary Cardiologist (physician) and Advanced Practice Providers (APPs -  Physician Assistants and Nurse Practitioners) who all work together to provide you with the care you need, when you need it. ? ?We recommend signing up for the patient portal called "MyChart".  Sign up information is provided on this After Visit Summary.  MyChart is used to connect with patients for Virtual Visits (Telemedicine).  Patients are able to view lab/test results, encounter notes, upcoming appointments, etc.  Non-urgent messages can be sent to your provider as well.   ?To learn more about what you can do with MyChart, go to [email or web address].   ? ?Your next appointment:   ?6  month(s) ? ?The format for your next appointment:   ?In Person ? ?Provider:   ?Jenne Campus, MD  ? ? ?Other Instructions ?None ? ?Important Information About Sugar ? ? ? ? ? ? ?

## 2021-08-25 ENCOUNTER — Other Ambulatory Visit: Payer: Self-pay | Admitting: Cardiology

## 2021-08-31 ENCOUNTER — Other Ambulatory Visit: Payer: Self-pay | Admitting: Cardiology

## 2021-09-01 ENCOUNTER — Other Ambulatory Visit: Payer: Medicare Other

## 2021-09-01 ENCOUNTER — Ambulatory Visit (INDEPENDENT_AMBULATORY_CARE_PROVIDER_SITE_OTHER): Payer: Medicare Other

## 2021-09-01 DIAGNOSIS — R0789 Other chest pain: Secondary | ICD-10-CM

## 2021-09-01 DIAGNOSIS — R0609 Other forms of dyspnea: Secondary | ICD-10-CM

## 2021-09-01 LAB — ECHOCARDIOGRAM COMPLETE
Area-P 1/2: 2.69 cm2
MV M vel: 4.5 m/s
MV Peak grad: 81 mmHg
S' Lateral: 3.3 cm

## 2021-09-08 ENCOUNTER — Other Ambulatory Visit: Payer: Self-pay | Admitting: Cardiology

## 2021-09-23 DIAGNOSIS — K649 Unspecified hemorrhoids: Secondary | ICD-10-CM | POA: Diagnosis not present

## 2021-09-23 DIAGNOSIS — R1013 Epigastric pain: Secondary | ICD-10-CM | POA: Diagnosis not present

## 2021-09-23 DIAGNOSIS — R1310 Dysphagia, unspecified: Secondary | ICD-10-CM | POA: Diagnosis not present

## 2021-09-23 DIAGNOSIS — K2270 Barrett's esophagus without dysplasia: Secondary | ICD-10-CM | POA: Diagnosis not present

## 2021-11-18 DIAGNOSIS — K2270 Barrett's esophagus without dysplasia: Secondary | ICD-10-CM | POA: Diagnosis not present

## 2021-11-18 DIAGNOSIS — K219 Gastro-esophageal reflux disease without esophagitis: Secondary | ICD-10-CM | POA: Diagnosis not present

## 2021-11-18 DIAGNOSIS — R1310 Dysphagia, unspecified: Secondary | ICD-10-CM | POA: Diagnosis not present

## 2021-11-18 DIAGNOSIS — K449 Diaphragmatic hernia without obstruction or gangrene: Secondary | ICD-10-CM | POA: Diagnosis not present

## 2021-11-19 DIAGNOSIS — M79671 Pain in right foot: Secondary | ICD-10-CM | POA: Diagnosis not present

## 2021-11-19 DIAGNOSIS — M2041 Other hammer toe(s) (acquired), right foot: Secondary | ICD-10-CM | POA: Diagnosis not present

## 2021-11-19 DIAGNOSIS — M19071 Primary osteoarthritis, right ankle and foot: Secondary | ICD-10-CM | POA: Diagnosis not present

## 2021-11-19 DIAGNOSIS — Q742 Other congenital malformations of lower limb(s), including pelvic girdle: Secondary | ICD-10-CM | POA: Diagnosis not present

## 2021-11-19 DIAGNOSIS — M67874 Other specified disorders of tendon, left ankle and foot: Secondary | ICD-10-CM | POA: Diagnosis not present

## 2021-11-19 DIAGNOSIS — M2012 Hallux valgus (acquired), left foot: Secondary | ICD-10-CM | POA: Diagnosis not present

## 2021-11-19 DIAGNOSIS — M2011 Hallux valgus (acquired), right foot: Secondary | ICD-10-CM | POA: Diagnosis not present

## 2021-11-19 DIAGNOSIS — Z6831 Body mass index (BMI) 31.0-31.9, adult: Secondary | ICD-10-CM | POA: Diagnosis not present

## 2021-11-19 DIAGNOSIS — M7732 Calcaneal spur, left foot: Secondary | ICD-10-CM | POA: Diagnosis not present

## 2021-11-19 DIAGNOSIS — M2042 Other hammer toe(s) (acquired), left foot: Secondary | ICD-10-CM | POA: Diagnosis not present

## 2021-11-19 DIAGNOSIS — M19072 Primary osteoarthritis, left ankle and foot: Secondary | ICD-10-CM | POA: Diagnosis not present

## 2021-11-19 DIAGNOSIS — M79672 Pain in left foot: Secondary | ICD-10-CM | POA: Diagnosis not present

## 2021-11-19 DIAGNOSIS — M7741 Metatarsalgia, right foot: Secondary | ICD-10-CM | POA: Diagnosis not present

## 2021-12-08 ENCOUNTER — Other Ambulatory Visit: Payer: Self-pay | Admitting: Cardiology

## 2021-12-11 ENCOUNTER — Telehealth: Payer: Self-pay | Admitting: *Deleted

## 2021-12-11 NOTE — Telephone Encounter (Signed)
   Name: Emily Ayers  DOB: 07-15-1950  MRN: [id number]   Primary Cardiologist: Jenne Campus, MD  Chart reviewed as part of pre-operative protocol coverage. Patient was contacted 12/11/2021 in reference to pre-operative risk assessment for pending surgery as outlined below.  Emily Ayers was last seen on 08/24/2021 by Dr. Agustin Cree.  Since that day, Emily Ayers has done well from a cardiac standpoint.  She denies any new symptoms or concerns.  She states she is no longer taking aspirin.  She is able to complete greater than 4 METS without any difficulty.  She walks approximately 3 miles a day.  Therefore, based on ACC/AHA guidelines, the patient would be at acceptable risk for the planned procedure without further cardiovascular testing.   The patient was advised that if she develops new symptoms prior to surgery to contact our office to arrange for a follow-up visit, and she verbalized understanding.  I will route this recommendation to the requesting party via Epic fax function and remove from pre-op pool. Please call with questions.  Lenna Sciara, NP 12/11/2021, 10:37 AM

## 2021-12-11 NOTE — Telephone Encounter (Signed)
   Pre-operative Risk Assessment    Patient Name: Emily Ayers  DOB: 01/03/1951 MRN: [id number]      Request for Surgical Clearance    Procedure:  HAMMERTOE CORRECTION   Date of Surgery:  Clearance 12/18/21                                 Surgeon:  CESAR DE Josephine Igo, MD Surgeon's Group or Practice Name:  [facility]  Phone number:  [phone] Fax number:  [phone]   Type of Clearance Requested:   - Pharmacy:  Hold Aspirin x's 7 days   Type of Anesthesia:   REGIONAL ANESTHESIA   Additional requests/questions:    Astrid Divine   12/11/2021, 10:19 AM

## 2021-12-14 DIAGNOSIS — I10 Essential (primary) hypertension: Secondary | ICD-10-CM | POA: Diagnosis not present

## 2021-12-14 DIAGNOSIS — Z01818 Encounter for other preprocedural examination: Secondary | ICD-10-CM | POA: Diagnosis not present

## 2021-12-14 DIAGNOSIS — I2510 Atherosclerotic heart disease of native coronary artery without angina pectoris: Secondary | ICD-10-CM | POA: Diagnosis not present

## 2021-12-14 DIAGNOSIS — Z79899 Other long term (current) drug therapy: Secondary | ICD-10-CM | POA: Diagnosis not present

## 2021-12-14 DIAGNOSIS — J45909 Unspecified asthma, uncomplicated: Secondary | ICD-10-CM | POA: Diagnosis not present

## 2021-12-14 DIAGNOSIS — I4729 Other ventricular tachycardia: Secondary | ICD-10-CM | POA: Diagnosis not present

## 2021-12-14 DIAGNOSIS — T82868A Thrombosis of vascular prosthetic devices, implants and grafts, initial encounter: Secondary | ICD-10-CM | POA: Diagnosis not present

## 2021-12-18 DIAGNOSIS — J45909 Unspecified asthma, uncomplicated: Secondary | ICD-10-CM | POA: Diagnosis not present

## 2021-12-18 DIAGNOSIS — M179 Osteoarthritis of knee, unspecified: Secondary | ICD-10-CM | POA: Diagnosis not present

## 2021-12-18 DIAGNOSIS — R351 Nocturia: Secondary | ICD-10-CM | POA: Diagnosis not present

## 2021-12-18 DIAGNOSIS — I10 Essential (primary) hypertension: Secondary | ICD-10-CM | POA: Diagnosis not present

## 2021-12-18 DIAGNOSIS — T82868D Thrombosis of vascular prosthetic devices, implants and grafts, subsequent encounter: Secondary | ICD-10-CM | POA: Diagnosis not present

## 2021-12-18 DIAGNOSIS — I2510 Atherosclerotic heart disease of native coronary artery without angina pectoris: Secondary | ICD-10-CM | POA: Diagnosis not present

## 2021-12-18 DIAGNOSIS — Z955 Presence of coronary angioplasty implant and graft: Secondary | ICD-10-CM | POA: Diagnosis not present

## 2021-12-18 DIAGNOSIS — M2011 Hallux valgus (acquired), right foot: Secondary | ICD-10-CM | POA: Diagnosis not present

## 2021-12-18 DIAGNOSIS — Z7951 Long term (current) use of inhaled steroids: Secondary | ICD-10-CM | POA: Diagnosis not present

## 2021-12-18 DIAGNOSIS — I4729 Other ventricular tachycardia: Secondary | ICD-10-CM | POA: Diagnosis not present

## 2021-12-18 DIAGNOSIS — K219 Gastro-esophageal reflux disease without esophagitis: Secondary | ICD-10-CM | POA: Diagnosis not present

## 2021-12-18 DIAGNOSIS — M2041 Other hammer toe(s) (acquired), right foot: Secondary | ICD-10-CM | POA: Diagnosis not present

## 2021-12-18 DIAGNOSIS — E785 Hyperlipidemia, unspecified: Secondary | ICD-10-CM | POA: Diagnosis not present

## 2021-12-18 DIAGNOSIS — M19079 Primary osteoarthritis, unspecified ankle and foot: Secondary | ICD-10-CM | POA: Diagnosis not present

## 2021-12-18 DIAGNOSIS — Z79899 Other long term (current) drug therapy: Secondary | ICD-10-CM | POA: Diagnosis not present

## 2021-12-18 DIAGNOSIS — K2270 Barrett's esophagus without dysplasia: Secondary | ICD-10-CM | POA: Diagnosis not present

## 2021-12-18 DIAGNOSIS — Z8616 Personal history of COVID-19: Secondary | ICD-10-CM | POA: Diagnosis not present

## 2021-12-18 DIAGNOSIS — D649 Anemia, unspecified: Secondary | ICD-10-CM | POA: Diagnosis not present

## 2021-12-18 DIAGNOSIS — M1710 Unilateral primary osteoarthritis, unspecified knee: Secondary | ICD-10-CM | POA: Diagnosis not present

## 2022-01-01 DIAGNOSIS — Z1331 Encounter for screening for depression: Secondary | ICD-10-CM | POA: Diagnosis not present

## 2022-01-01 DIAGNOSIS — Z0000 Encounter for general adult medical examination without abnormal findings: Secondary | ICD-10-CM | POA: Diagnosis not present

## 2022-01-01 DIAGNOSIS — Z9181 History of falling: Secondary | ICD-10-CM | POA: Diagnosis not present

## 2022-01-01 DIAGNOSIS — Z139 Encounter for screening, unspecified: Secondary | ICD-10-CM | POA: Diagnosis not present

## 2022-01-01 DIAGNOSIS — E785 Hyperlipidemia, unspecified: Secondary | ICD-10-CM | POA: Diagnosis not present

## 2022-01-20 DIAGNOSIS — J45909 Unspecified asthma, uncomplicated: Secondary | ICD-10-CM | POA: Diagnosis not present

## 2022-01-20 DIAGNOSIS — I129 Hypertensive chronic kidney disease with stage 1 through stage 4 chronic kidney disease, or unspecified chronic kidney disease: Secondary | ICD-10-CM | POA: Diagnosis not present

## 2022-01-20 DIAGNOSIS — E785 Hyperlipidemia, unspecified: Secondary | ICD-10-CM | POA: Diagnosis not present

## 2022-01-20 DIAGNOSIS — Z1231 Encounter for screening mammogram for malignant neoplasm of breast: Secondary | ICD-10-CM | POA: Diagnosis not present

## 2022-01-20 DIAGNOSIS — E559 Vitamin D deficiency, unspecified: Secondary | ICD-10-CM | POA: Diagnosis not present

## 2022-01-20 DIAGNOSIS — M810 Age-related osteoporosis without current pathological fracture: Secondary | ICD-10-CM | POA: Diagnosis not present

## 2022-01-20 DIAGNOSIS — Z79899 Other long term (current) drug therapy: Secondary | ICD-10-CM | POA: Diagnosis not present

## 2022-01-20 DIAGNOSIS — F419 Anxiety disorder, unspecified: Secondary | ICD-10-CM | POA: Diagnosis not present

## 2022-01-20 DIAGNOSIS — I2510 Atherosclerotic heart disease of native coronary artery without angina pectoris: Secondary | ICD-10-CM | POA: Diagnosis not present

## 2022-01-20 DIAGNOSIS — M1990 Unspecified osteoarthritis, unspecified site: Secondary | ICD-10-CM | POA: Diagnosis not present

## 2022-01-20 DIAGNOSIS — Z9861 Coronary angioplasty status: Secondary | ICD-10-CM | POA: Diagnosis not present

## 2022-01-28 DIAGNOSIS — Z4789 Encounter for other orthopedic aftercare: Secondary | ICD-10-CM | POA: Diagnosis not present

## 2022-02-11 ENCOUNTER — Telehealth: Payer: Self-pay

## 2022-02-11 NOTE — Telephone Encounter (Signed)
PA submitted on CMM for Praluent. Key [id number]

## 2022-02-18 DIAGNOSIS — Z23 Encounter for immunization: Secondary | ICD-10-CM | POA: Diagnosis not present

## 2022-02-19 NOTE — Telephone Encounter (Signed)
PA approved for Praluent through 05/16/22 

## 2022-03-02 DIAGNOSIS — Z1231 Encounter for screening mammogram for malignant neoplasm of breast: Secondary | ICD-10-CM | POA: Diagnosis not present

## 2022-03-08 ENCOUNTER — Telehealth: Payer: Self-pay

## 2022-03-08 ENCOUNTER — Ambulatory Visit: Payer: Medicare Other | Attending: Cardiology | Admitting: Cardiology

## 2022-03-08 ENCOUNTER — Encounter: Payer: Self-pay | Admitting: Cardiology

## 2022-03-08 VITALS — BP 114/68 | HR 70 | Ht 68.0 in | Wt 178.8 lb

## 2022-03-08 DIAGNOSIS — I10 Essential (primary) hypertension: Secondary | ICD-10-CM | POA: Diagnosis not present

## 2022-03-08 DIAGNOSIS — E782 Mixed hyperlipidemia: Secondary | ICD-10-CM | POA: Diagnosis not present

## 2022-03-08 DIAGNOSIS — I2510 Atherosclerotic heart disease of native coronary artery without angina pectoris: Secondary | ICD-10-CM | POA: Diagnosis not present

## 2022-03-08 NOTE — Patient Outreach (Signed)
  Care Coordination   Initial Visit Note   03/08/2022 Name: Emily Ayers MRN: [id number] DOB: [birth year]  Emily Ayers is a 71 y.o. year old female who sees Nicoletta Dress, MD for primary care. I spoke with  Emily Ayers by phone today.  What matters to the patients health and wellness today?  Placed call to patient today to review and offer [facility] care coordination program.  Patient reports that she is at MD office and would like a call back.      SDOH assessments and interventions completed:  No     Care Coordination Interventions Activated:  No  Care Coordination Interventions:  No, not indicated   Follow up plan:  request a call back    Encounter Outcome:  Pt. Request to Call Back   Tomasa Rand, RN, BSN, CEN [place] Coordinator [phone]

## 2022-03-08 NOTE — Patient Instructions (Signed)
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
## 2022-03-09 DIAGNOSIS — Z4789 Encounter for other orthopedic aftercare: Secondary | ICD-10-CM | POA: Diagnosis not present

## 2022-03-09 DIAGNOSIS — M19072 Primary osteoarthritis, left ankle and foot: Secondary | ICD-10-CM | POA: Diagnosis not present

## 2022-03-09 DIAGNOSIS — M2011 Hallux valgus (acquired), right foot: Secondary | ICD-10-CM | POA: Diagnosis not present

## 2022-03-09 DIAGNOSIS — M19071 Primary osteoarthritis, right ankle and foot: Secondary | ICD-10-CM | POA: Diagnosis not present

## 2022-03-11 ENCOUNTER — Other Ambulatory Visit: Payer: Self-pay | Admitting: Cardiology

## 2022-03-11 NOTE — Telephone Encounter (Signed)
Rx refill sent to pharmacy. 

## 2022-03-15 ENCOUNTER — Other Ambulatory Visit: Payer: Self-pay | Admitting: Cardiology

## 2022-03-15 DIAGNOSIS — M79671 Pain in right foot: Secondary | ICD-10-CM | POA: Diagnosis not present

## 2022-03-15 NOTE — Telephone Encounter (Signed)
Rx refill sent to pharmacy. 

## 2022-03-17 ENCOUNTER — Telehealth: Payer: Self-pay

## 2022-03-17 NOTE — Patient Outreach (Signed)
  Care Coordination   Initial Visit Note   03/17/2022 Name: Emily Ayers MRN: [id number] DOB: 1950/11/21  Emily Ayers is a 71 y.o. year old female who sees Nicoletta Dress, MD for primary care. I spoke with  Emily Ayers by phone today.  What matters to the patients health and wellness today?  Placed call to patient today and reviewed [facility] care coordination program.  Patient declines needs at this time but was interested in contact information for future. I provided my contact information.     SDOH assessments and interventions completed:  No     Care Coordination Interventions Activated:  Yes  Care Coordination Interventions:  Yes, provided   Follow up plan: No further intervention required.   Encounter Outcome:  Pt. Refused   Tomasa Rand, RN, BSN, CEN [facility] [employer] [phone]

## 2022-03-18 DIAGNOSIS — M79671 Pain in right foot: Secondary | ICD-10-CM | POA: Diagnosis not present

## 2022-03-22 DIAGNOSIS — M79671 Pain in right foot: Secondary | ICD-10-CM | POA: Diagnosis not present

## 2022-03-26 DIAGNOSIS — M79671 Pain in right foot: Secondary | ICD-10-CM | POA: Diagnosis not present

## 2022-03-30 DIAGNOSIS — M79671 Pain in right foot: Secondary | ICD-10-CM | POA: Diagnosis not present

## 2022-04-05 DIAGNOSIS — Z23 Encounter for immunization: Secondary | ICD-10-CM | POA: Diagnosis not present

## 2022-04-12 DIAGNOSIS — M79671 Pain in right foot: Secondary | ICD-10-CM | POA: Diagnosis not present

## 2022-04-19 ENCOUNTER — Telehealth: Payer: Self-pay | Admitting: Cardiology

## 2022-04-19 NOTE — Telephone Encounter (Signed)
Pt c/o medication issue:  1. Name of Medication:  Alirocumab ([place]) 75 MG/ML SOAJ  2. How are you currently taking this medication (dosage and times per day)?   3. Are you having a reaction (difficulty breathing--STAT)?   4. What is your medication issue?    Patient would like to know if she can be switched to [facility]. She states the Praluent will cost her about $800-900 yearly, but the [place] will be fully covered through Part D of her insurance.

## 2022-04-21 ENCOUNTER — Encounter: Payer: Self-pay | Admitting: Cardiology

## 2022-04-21 DIAGNOSIS — E782 Mixed hyperlipidemia: Secondary | ICD-10-CM

## 2022-04-23 NOTE — Telephone Encounter (Signed)
Sent My Chart message to pt regarding referral to Lipid clinic. Referral made.

## 2022-05-06 DIAGNOSIS — H35372 Puckering of macula, left eye: Secondary | ICD-10-CM | POA: Diagnosis not present

## 2022-05-14 ENCOUNTER — Telehealth: Payer: Self-pay | Admitting: Cardiology

## 2022-05-14 NOTE — Telephone Encounter (Signed)
Pt reached out today about lipid clinic appt. Informed her that Dr. Debara Pickett is booked through June 2024 right now. I have placed her on his wait list, however she wants to know what she should be doing in the meantime to manage her cholesterol. Please advise.

## 2022-05-14 NOTE — Telephone Encounter (Signed)
Left a message for the patient to call back.  

## 2022-05-18 DIAGNOSIS — Z96651 Presence of right artificial knee joint: Secondary | ICD-10-CM | POA: Diagnosis not present

## 2022-05-18 NOTE — Telephone Encounter (Signed)
Sent message to Lipid Clini Scheduler to call pt with sooner appt or cancellations. Scheduler stated that she would call the pt.

## 2022-05-19 ENCOUNTER — Encounter: Payer: Self-pay | Admitting: Cardiology

## 2022-06-04 ENCOUNTER — Other Ambulatory Visit (HOSPITAL_COMMUNITY): Payer: Self-pay

## 2022-06-04 DIAGNOSIS — H3589 Other specified retinal disorders: Secondary | ICD-10-CM | POA: Diagnosis not present

## 2022-06-04 DIAGNOSIS — H35372 Puckering of macula, left eye: Secondary | ICD-10-CM | POA: Diagnosis not present

## 2022-06-04 DIAGNOSIS — H43813 Vitreous degeneration, bilateral: Secondary | ICD-10-CM | POA: Diagnosis not present

## 2022-06-10 ENCOUNTER — Ambulatory Visit: Payer: Medicare Other

## 2022-06-10 ENCOUNTER — Ambulatory Visit
Payer: Medicare Other | Attending: Internal Medicine | Admitting: Pharmacist Clinician (PhC)/ Clinical Pharmacy Specialist

## 2022-06-10 DIAGNOSIS — E785 Hyperlipidemia, unspecified: Secondary | ICD-10-CM

## 2022-06-10 NOTE — Progress Notes (Signed)
Office Visit    Patient Name: Emily Ayers Date of Encounter: 06/14/2022  Primary Care Provider:  Nicoletta Dress, MD Primary Cardiologist:  Jenne Campus, MD  Chief Complaint    Hyperlipidemia   Significant Past Medical History   HTN amlodipine 2.5 mg, valsartan 320 mg   CAD Coronary artery disease involving native coronary artery of native heart without angina pectoris, status post PTCA and stenting of mid LAD and diagonal branch  HLD ezetimibe 10 mg, Praluent 75 mg/mL     Allergies  Allergen Reactions   Morphine And Related Itching   Buprenorphine Hcl Itching   Oxycodone     Other reaction(s): Hallucination    History of Present Illness    Emily Ayers is a 72 y.o. female patient of Dr Emily Ayers.   Patient was previously on Praluent and was told that she would be a good candidate for Lequivo so she is here to start the process of getting it. She states that Praluent was costing her about $800-$900 yearly and she wanted something that was cheaper. She has been on Praluent for years and she thinks that it has caused some diarrhea. She stopped taking Praluent in mid-December because she was anticipating starting Leqvio. She has already researched Leqvio and wants to pursue this option. I informed her that her LDL needs to be below 70 to start Leqvio.   Current Medications: Praluent 75 mg/ml, ezetimibe 10 mg daily  Insurance Carrier:  [employer]  LDL Cholesterol goal:    Current Medications:     Previously tried:    Family Hx:     Social Hx: Tobacco: Alcohol:      Diet:      Exercise:   Adherence Assessment  Do you ever forget to take your medication? '[]'$ Yes '[]'$ No  Do you ever skip doses due to side effects? '[]'$ Yes '[]'$ No  Do you have trouble affording your medicines? '[]'$ Yes '[]'$ No  Are you ever unable to pick up your medication due to transportation difficulties? '[]'$ Yes '[]'$ No  Do you ever stop taking your medications because you don't believe  they are helping? '[]'$ Yes '[]'$ No      Accessory Clinical Findings   9/23::  TC 147, TG 140, HDL 68, LDL 55  (outside lab)  Lab Results  Component Value Date   CHOL 198 01/09/2021   HDL 62 01/09/2021   LDLCALC 116 (H) 01/09/2021   TRIG 114 01/09/2021   CHOLHDL 3.2 01/09/2021    Lab Results  Component Value Date   ALT 23 11/20/2012   AST 26 11/20/2012   ALKPHOS 64 11/20/2012   BILITOT 0.2 (L) 11/20/2012   Lab Results  Component Value Date   CREATININE 1.28 (H) 05/20/2020   BUN 19 05/20/2020   NA 141 05/20/2020   K 4.4 05/20/2020   CL 102 05/20/2020   CO2 25 05/20/2020   No results found for: "HGBA1C"  Home Medications    Current Outpatient Medications  Medication Sig Dispense Refill   albuterol (PROVENTIL HFA;VENTOLIN HFA) 108 (90 BASE) MCG/ACT inhaler Inhale 2 puffs into the lungs every 6 (six) hours as needed for wheezing.     Alirocumab (PRALUENT) 75 MG/ML SOAJ INJECT 1ML INTO THE SKIN EVERY 14 DAYS. 6 mL 0   ALPRAZolam (XANAX) 0.5 MG tablet Take 0.5 mg by mouth at bedtime as needed for anxiety or sleep.      amLODipine (NORVASC) 2.5 MG tablet Take 1 tablet (2.5 mg total) by mouth daily. 90 tablet 3  celecoxib (CELEBREX) 200 MG capsule Take 200 mg by mouth as needed for mild pain or moderate pain.     citalopram (CELEXA) 10 MG tablet Take 10 mg by mouth daily.     Cranberry 300 MG tablet Take 300 mg by mouth 2 (two) times daily.     DULERA 200-5 MCG/ACT AERO Inhale 2 puffs into the lungs daily.      ergocalciferol (VITAMIN D2) 1.25 MG (50000 UT) capsule Take 50,000 Units by mouth once a week.     ezetimibe (ZETIA) 10 MG tablet Take 1 tablet (10 mg total) by mouth daily. 90 tablet 3   NASONEX 50 MCG/ACT nasal spray Place 2 sprays into the nose daily.      nitroGLYCERIN (NITROSTAT) 0.4 MG SL tablet Place 1 tablet (0.4 mg total) under the tongue as needed for chest pain. 25 tablet 1   Omega-3 1000 MG CAPS Take 1,000 mg by mouth daily.     omeprazole (PRILOSEC) 20 MG  capsule Take 20 mg by mouth daily.     ranolazine (RANEXA) 500 MG 12 hr tablet Take 1 tablet (500 mg total) by mouth 2 (two) times daily. 180 tablet 3   valsartan (DIOVAN) 320 MG tablet Take 320 mg by mouth daily.     No current facility-administered medications for this visit.     Assessment & Plan    Dyslipidemia Assessment: Most recent LDL is 55 (below goal <70) She is tolerating Praluent and ezetimibe but would like to switch to Leqvio due to cost and less frequent injections. For her insurance to approve Leqvio, her LDL has to be >70.    Plan: Patient to stop taking Praluent and ezetimibe Patient to return for lipid labs in 1 month.  We will reach out to the patient when her LDL is >70 to give her instructions about starting Leqvio.   [employer]  Class of 2024  I was with student and patient for entire appointment and agree with above assessment and plan.  Tommy Medal, PharmD CPP [facility] [address] [place]  [place], [place] [address] [phone]  06/14/2022, 7:06 AM

## 2022-06-10 NOTE — Patient Instructions (Signed)
Thank you for choosing [facility]  I will send you a message at the end of February to get your cholesterol labs checked.  Once we get those results we will put in a request to your insurance company for the Leqvio injections.   These are usually done at our infusion center on [address].

## 2022-06-14 ENCOUNTER — Encounter: Payer: Self-pay | Admitting: Pharmacist Clinician (PhC)/ Clinical Pharmacy Specialist

## 2022-06-14 NOTE — Assessment & Plan Note (Signed)
Assessment: Most recent LDL is 55 (below goal <70) She is tolerating Praluent and ezetimibe but would like to switch to Leqvio due to cost and less frequent injections. For her insurance to approve Leqvio, her LDL has to be >70.    Plan: Patient to stop taking Praluent and ezetimibe Patient to return for lipid labs in 1 month.  We will reach out to the patient when her LDL is >70 to give her instructions about starting Leqvio.

## 2022-07-06 ENCOUNTER — Encounter: Payer: Self-pay | Admitting: Pharmacist Clinician (PhC)/ Clinical Pharmacy Specialist

## 2022-07-06 DIAGNOSIS — E782 Mixed hyperlipidemia: Secondary | ICD-10-CM

## 2022-07-20 ENCOUNTER — Other Ambulatory Visit: Payer: Self-pay

## 2022-07-20 DIAGNOSIS — E782 Mixed hyperlipidemia: Secondary | ICD-10-CM | POA: Diagnosis not present

## 2022-07-21 DIAGNOSIS — Z79899 Other long term (current) drug therapy: Secondary | ICD-10-CM | POA: Diagnosis not present

## 2022-07-21 DIAGNOSIS — F419 Anxiety disorder, unspecified: Secondary | ICD-10-CM | POA: Diagnosis not present

## 2022-07-21 DIAGNOSIS — Z9861 Coronary angioplasty status: Secondary | ICD-10-CM | POA: Diagnosis not present

## 2022-07-21 DIAGNOSIS — I129 Hypertensive chronic kidney disease with stage 1 through stage 4 chronic kidney disease, or unspecified chronic kidney disease: Secondary | ICD-10-CM | POA: Diagnosis not present

## 2022-07-21 DIAGNOSIS — E559 Vitamin D deficiency, unspecified: Secondary | ICD-10-CM | POA: Diagnosis not present

## 2022-07-21 DIAGNOSIS — E785 Hyperlipidemia, unspecified: Secondary | ICD-10-CM | POA: Diagnosis not present

## 2022-07-21 DIAGNOSIS — I2510 Atherosclerotic heart disease of native coronary artery without angina pectoris: Secondary | ICD-10-CM | POA: Diagnosis not present

## 2022-07-21 LAB — LIPID PANEL
Chol/HDL Ratio: 4.8 ratio — ABNORMAL HIGH (ref 0.0–4.4)
Cholesterol, Total: 276 mg/dL — ABNORMAL HIGH (ref 100–199)
HDL: 58 mg/dL (ref >39)
LDL Chol Calc (NIH): 173 mg/dL — ABNORMAL HIGH (ref 0–99)
Triglycerides: 241 mg/dL — ABNORMAL HIGH (ref 0–149)
VLDL Cholesterol Cal: 45 mg/dL — ABNORMAL HIGH (ref 5–40)

## 2022-07-30 ENCOUNTER — Telehealth: Payer: Self-pay

## 2022-07-30 NOTE — Patient Outreach (Signed)
  Care Coordination   Initial Visit Note   07/30/2022 Name: Emily Ayers MRN: [id number] DOB: 05-01-1951  Emily Ayers is a 72 y.o. year old female who sees Nicoletta Dress, MD for primary care. I spoke with  Emily Ayers by phone today.  What matters to the patients health and wellness today?  Placed call to patient to offer and review [facility] care coordination program. Patient reports that she is doing well and denies any needs today. Provided my contact information if patient has needs in the future.      SDOH assessments and interventions completed:  No     Care Coordination Interventions:  No, not indicated   Follow up plan: No further intervention required.   Encounter Outcome:  Pt. Refused   Tomasa Rand, RN, BSN, CEN [facility] [employer] [phone]

## 2022-08-18 ENCOUNTER — Encounter: Payer: Self-pay | Admitting: Cardiology

## 2022-08-18 ENCOUNTER — Telehealth: Payer: Self-pay | Admitting: Pharmacy Technician

## 2022-08-18 NOTE — Telephone Encounter (Signed)
Emily Ayers note:  Auth Submission: NO AUTH NEEDED Site of care: Site of care: [facility] Payer: MEDICARE A/B & BCBS SUPP Medication & CPT/J Code(s) submitted: Leqvio (Inclisiran) [phone] Route of submission (phone, fax, portal):  Phone # Fax # Auth type: Buy/Bill Units/visits requested: 3 Reference number:  Approval from: 08/18/22 to 05/17/23    Patient will be scheduled as soon as possible

## 2022-08-26 ENCOUNTER — Other Ambulatory Visit: Payer: Self-pay | Admitting: Cardiology

## 2022-08-26 NOTE — Telephone Encounter (Signed)
Rx to pharmacy

## 2022-08-31 ENCOUNTER — Ambulatory Visit (INDEPENDENT_AMBULATORY_CARE_PROVIDER_SITE_OTHER): Payer: Medicare Other

## 2022-08-31 VITALS — BP 139/88 | HR 67 | Temp 97.4°F | Resp 20 | Ht 67.5 in | Wt 175.4 lb

## 2022-08-31 DIAGNOSIS — E782 Mixed hyperlipidemia: Secondary | ICD-10-CM | POA: Diagnosis not present

## 2022-08-31 DIAGNOSIS — E785 Hyperlipidemia, unspecified: Secondary | ICD-10-CM

## 2022-08-31 MED ORDER — INCLISIRAN SODIUM 284 MG/1.5ML ~~LOC~~ SOSY
284.0000 mg | PREFILLED_SYRINGE | Freq: Once | SUBCUTANEOUS | Status: AC
  Administered 2022-08-31: 284 mg via SUBCUTANEOUS
  Filled 2022-08-31: qty 1.5

## 2022-08-31 NOTE — Progress Notes (Signed)
Diagnosis: Hyperlipidemia  Provider:  Praveen Mannam MD  Procedure: Injection  Leqvio (inclisiran), Dose: 284 mg, Site: subcutaneous, Number of injections: 1  Post Care: Observation period completed  Discharge: Condition: Good, Destination: Home . AVS Provided  Performed by:  Suhayb Anzalone, RN       

## 2022-09-23 ENCOUNTER — Encounter: Payer: Self-pay | Admitting: Cardiology

## 2022-09-23 ENCOUNTER — Ambulatory Visit: Payer: Medicare Other | Attending: Cardiology | Admitting: Cardiology

## 2022-09-23 VITALS — BP 130/92 | HR 63 | Ht 67.5 in | Wt 172.8 lb

## 2022-09-23 DIAGNOSIS — I4729 Other ventricular tachycardia: Secondary | ICD-10-CM | POA: Diagnosis not present

## 2022-09-23 DIAGNOSIS — E785 Hyperlipidemia, unspecified: Secondary | ICD-10-CM | POA: Diagnosis not present

## 2022-09-23 DIAGNOSIS — I2510 Atherosclerotic heart disease of native coronary artery without angina pectoris: Secondary | ICD-10-CM | POA: Insufficient documentation

## 2022-09-23 DIAGNOSIS — E782 Mixed hyperlipidemia: Secondary | ICD-10-CM | POA: Insufficient documentation

## 2022-09-23 DIAGNOSIS — I10 Essential (primary) hypertension: Secondary | ICD-10-CM | POA: Insufficient documentation

## 2022-09-23 MED ORDER — CLOPIDOGREL BISULFATE 75 MG PO TABS: 75.0000 mg | ORAL_TABLET | Freq: Every day | ORAL | 3 refills | Status: DC
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
## 2022-09-23 NOTE — Patient Instructions (Addendum)
Medication Instructions:   START: Plavix 75mg  1 tablet daily   Lab Work: Fasting Lipid Panel - today If you have labs (blood work) drawn today and your tests are completely normal, you will receive your results only by: MyChart Message (if you have MyChart) OR A paper copy in the mail If you have any lab test that is abnormal or we need to change your treatment, we will call you to review the results.   Testing/Procedures: None Ordered   Follow-Up: At [facility], you and your health needs are our priority.  As part of our continuing mission to provide you with exceptional heart care, we have created designated Provider Care Teams.  These Care Teams include your primary Cardiologist (physician) and Advanced Practice Providers (APPs -  Physician Assistants and Nurse Practitioners) who all work together to provide you with the care you need, when you need it.  We recommend signing up for the patient portal called "MyChart".  Sign up information is provided on this After Visit Summary.  MyChart is used to connect with patients for Virtual Visits (Telemedicine).  Patients are able to view lab/test results, encounter notes, upcoming appointments, etc.  Non-urgent messages can be sent to your provider as well.   To learn more about what you can do with MyChart, go to [email or web address].    Your next appointment:   6 month(s)  The format for your next appointment:   In Person  Provider:   Gypsy Balsam, MD    Other Instructions NA

## 2022-09-24 LAB — LIPID PANEL
Chol/HDL Ratio: 3 ratio (ref 0.0–4.4)
Cholesterol, Total: 192 mg/dL (ref 100–199)
HDL: 65 mg/dL (ref >39)
LDL Chol Calc (NIH): 102 mg/dL — ABNORMAL HIGH (ref 0–99)
Triglycerides: 146 mg/dL (ref 0–149)
VLDL Cholesterol Cal: 25 mg/dL (ref 5–40)

## 2022-11-30 ENCOUNTER — Ambulatory Visit: Payer: Medicare Other

## 2022-11-30 VITALS — BP 154/83 | HR 72 | Temp 97.5°F | Resp 16 | Ht 67.5 in | Wt 173.0 lb

## 2022-11-30 DIAGNOSIS — E782 Mixed hyperlipidemia: Secondary | ICD-10-CM | POA: Diagnosis not present

## 2022-11-30 DIAGNOSIS — E785 Hyperlipidemia, unspecified: Secondary | ICD-10-CM

## 2022-11-30 MED ORDER — INCLISIRAN SODIUM 284 MG/1.5ML ~~LOC~~ SOSY
284.0000 mg | PREFILLED_SYRINGE | Freq: Once | SUBCUTANEOUS | Status: AC
  Administered 2022-11-30: 284 mg via SUBCUTANEOUS
  Filled 2022-11-30: qty 1.5

## 2022-11-30 NOTE — Progress Notes (Signed)
Diagnosis: Hyperlipidemia  Provider:  Chilton Greathouse MD  Procedure: Injection  Leqvio (inclisiran), Dose: 284 mg, Site: subcutaneous, Number of injections: 1  Post Care: Patient declined observation  Discharge: Condition: Good, Destination: Home . AVS Declined  Performed by:  Rico Ala, LPN

## 2022-12-14 DIAGNOSIS — Z4789 Encounter for other orthopedic aftercare: Secondary | ICD-10-CM | POA: Diagnosis not present

## 2022-12-14 DIAGNOSIS — M2042 Other hammer toe(s) (acquired), left foot: Secondary | ICD-10-CM | POA: Diagnosis not present

## 2022-12-14 DIAGNOSIS — M2011 Hallux valgus (acquired), right foot: Secondary | ICD-10-CM | POA: Diagnosis not present

## 2022-12-14 DIAGNOSIS — Z981 Arthrodesis status: Secondary | ICD-10-CM | POA: Diagnosis not present

## 2022-12-14 DIAGNOSIS — M2041 Other hammer toe(s) (acquired), right foot: Secondary | ICD-10-CM | POA: Diagnosis not present

## 2022-12-20 DIAGNOSIS — R1310 Dysphagia, unspecified: Secondary | ICD-10-CM | POA: Diagnosis not present

## 2022-12-20 DIAGNOSIS — K649 Unspecified hemorrhoids: Secondary | ICD-10-CM | POA: Diagnosis not present

## 2022-12-20 DIAGNOSIS — K2270 Barrett's esophagus without dysplasia: Secondary | ICD-10-CM | POA: Diagnosis not present

## 2022-12-20 DIAGNOSIS — R1013 Epigastric pain: Secondary | ICD-10-CM | POA: Diagnosis not present

## 2022-12-21 ENCOUNTER — Telehealth: Payer: Self-pay | Admitting: Cardiology

## 2022-12-21 ENCOUNTER — Telehealth: Payer: Self-pay

## 2022-12-21 NOTE — Telephone Encounter (Signed)
   Name: Emily Ayers  DOB: 1950-10-01  MRN: [id number]  Primary Cardiologist: Gypsy Balsam, MD   Preoperative team, please contact this patient and set up a phone call appointment for further preoperative risk assessment. Please obtain consent and complete medication review. Thank you for your help.  I confirm that guidance regarding antiplatelet and oral anticoagulation therapy has been completed and, if necessary, noted below.   Carlos Levering, NP 12/21/2022, 3:45 PM [facility]

## 2022-12-21 NOTE — Telephone Encounter (Signed)
Pt is scheduled for tele appt 08/26 at 2pm. Med rec and consent done

## 2022-12-21 NOTE — Telephone Encounter (Signed)
Pt is scheduled for tele appt 08/26 at 2pm. Med rec and consent done    Patient Consent for Virtual Visit        [name] has provided verbal consent on 12/21/2022 for a virtual visit (video or telephone).   CONSENT FOR VIRTUAL VISIT FOR:  Emily Ayers  By participating in this virtual visit I agree to the following:  I hereby voluntarily request, consent and authorize [facility] and its employed or contracted physicians, physician assistants, nurse practitioners or other licensed health care professionals (the Practitioner), to provide me with telemedicine health care services (the "Services") as deemed necessary by the treating Practitioner. I acknowledge and consent to receive the Services by the Practitioner via telemedicine. I understand that the telemedicine visit will involve communicating with the Practitioner through live audiovisual communication technology and the disclosure of certain medical information by electronic transmission. I acknowledge that I have been given the opportunity to request an in-person assessment or other available alternative prior to the telemedicine visit and am voluntarily participating in the telemedicine visit.  I understand that I have the right to withhold or withdraw my consent to the use of telemedicine in the course of my care at any time, without affecting my right to future care or treatment, and that the Practitioner or I may terminate the telemedicine visit at any time. I understand that I have the right to inspect all information obtained and/or recorded in the course of the telemedicine visit and may receive copies of available information for a reasonable fee.  I understand that some of the potential risks of receiving the Services via telemedicine include:  Delay or interruption in medical evaluation due to technological equipment failure or disruption; Information transmitted may not be sufficient (e.g. poor resolution  of images) to allow for appropriate medical decision making by the Practitioner; and/or  In rare instances, security protocols could fail, causing a breach of personal health information.  Furthermore, I acknowledge that it is my responsibility to provide information about my medical history, conditions and care that is complete and accurate to the best of my ability. I acknowledge that Practitioner's advice, recommendations, and/or decision may be based on factors not within their control, such as incomplete or inaccurate data provided by me or distortions of diagnostic images or specimens that may result from electronic transmissions. I understand that the practice of medicine is not an exact science and that Practitioner makes no warranties or guarantees regarding treatment outcomes. I acknowledge that a copy of this consent can be made available to me via my patient portal [facility] MyChart), or I can request a printed copy by calling the office of [facility].    I understand that my insurance will be billed for this visit.   I have read or had this consent read to me. I understand the contents of this consent, which adequately explains the benefits and risks of the Services being provided via telemedicine.  I have been provided ample opportunity to ask questions regarding this consent and the Services and have had my questions answered to my satisfaction. I give my informed consent for the services to be provided through the use of telemedicine in my medical care

## 2022-12-21 NOTE — Telephone Encounter (Signed)
   Pre-operative Risk Assessment    Patient Name: Emily Ayers  DOB: Dec 11, 1950 MRN: [id number]      Request for Surgical Clearance    Procedure:   EGD  Date of Surgery:  Clearance 01/19/23                                 Surgeon:  Misenhelmer Surgeon's Group or Practice Name:  [facility]  Phone number:  [phone] Fax number:  [phone]   Type of Clearance Requested:   - Pharmacy:  Hold Clopidogrel (Plavix)     Type of Anesthesia:  Not Indicated   Additional requests/questions:    Golden Pop   12/21/2022, 9:24 AM

## 2022-12-23 ENCOUNTER — Telehealth: Payer: Self-pay | Admitting: Cardiology

## 2022-12-23 NOTE — Telephone Encounter (Signed)
   Pre-operative Risk Assessment    Patient Name: Emily Ayers  DOB: December 31, 1950 MRN: [id number]      Request for Surgical Clearance    Procedure:   EGD  Date of Surgery:  Clearance 01/19/23                                 Surgeon:  Jodell Cipro, MD  Surgeon's Group or Practice Name:  [facility]  Phone number:  Unknown Fax number:  [phone]   Type of Clearance Requested:   - Pharmacy:  Hold Clopidogrel (Plavix)     Type of Anesthesia:  Not Indicated   Additional requests/questions:    Golden Pop   12/23/2022, 10:10 AM

## 2022-12-23 NOTE — Telephone Encounter (Signed)
   Name: Emily Ayers  DOB: [birth year]  MRN: [id number]  Primary Cardiologist: Gypsy Balsam, MD  Chart reviewed as part of pre-operative protocol coverage. The patient has an upcoming visit scheduled with APP on 01/10/2023 at 2 pm as a virtual visit, at which time clearance can be addressed in case there are any issues that would impact surgical recommendations.  EDG Is not scheduled until 01/19/2023 as below. I added preop FYI to appointment note so that provider is aware to address at time of outpatient visit.  Per office protocol the cardiology provider should forward their finalized clearance decision and recommendations regarding antiplatelet therapy to the requesting party below.     Input on holding Plavix for 5 days as requested below so that this information is available to the clearing provider at time of patient's appointment.   I will route this message as FYI to requesting party and remove this message from the preop box as separate preop APP input not needed at this time.   Please call with any questions.  Joni Reining, NP  12/23/2022, 2:16 PM

## 2022-12-23 NOTE — Telephone Encounter (Signed)
Will route to requesting surgeon's office to make them aware.

## 2023-01-10 ENCOUNTER — Ambulatory Visit: Payer: Medicare Other | Attending: Cardiology | Admitting: Student

## 2023-01-10 DIAGNOSIS — Z01810 Encounter for preprocedural cardiovascular examination: Secondary | ICD-10-CM
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
## 2023-01-21 DIAGNOSIS — Z9861 Coronary angioplasty status: Secondary | ICD-10-CM | POA: Diagnosis not present

## 2023-01-21 DIAGNOSIS — J45909 Unspecified asthma, uncomplicated: Secondary | ICD-10-CM | POA: Diagnosis not present

## 2023-01-21 DIAGNOSIS — M1990 Unspecified osteoarthritis, unspecified site: Secondary | ICD-10-CM | POA: Diagnosis not present

## 2023-01-21 DIAGNOSIS — E559 Vitamin D deficiency, unspecified: Secondary | ICD-10-CM | POA: Diagnosis not present

## 2023-01-21 DIAGNOSIS — I2510 Atherosclerotic heart disease of native coronary artery without angina pectoris: Secondary | ICD-10-CM | POA: Diagnosis not present

## 2023-01-21 DIAGNOSIS — F419 Anxiety disorder, unspecified: Secondary | ICD-10-CM | POA: Diagnosis not present

## 2023-01-21 DIAGNOSIS — I129 Hypertensive chronic kidney disease with stage 1 through stage 4 chronic kidney disease, or unspecified chronic kidney disease: Secondary | ICD-10-CM | POA: Diagnosis not present

## 2023-01-21 DIAGNOSIS — E785 Hyperlipidemia, unspecified: Secondary | ICD-10-CM | POA: Diagnosis not present

## 2023-01-21 DIAGNOSIS — M810 Age-related osteoporosis without current pathological fracture: Secondary | ICD-10-CM | POA: Diagnosis not present

## 2023-01-21 DIAGNOSIS — Z9181 History of falling: Secondary | ICD-10-CM | POA: Diagnosis not present

## 2023-01-24 DIAGNOSIS — E559 Vitamin D deficiency, unspecified: Secondary | ICD-10-CM | POA: Diagnosis not present

## 2023-01-24 DIAGNOSIS — I129 Hypertensive chronic kidney disease with stage 1 through stage 4 chronic kidney disease, or unspecified chronic kidney disease: Secondary | ICD-10-CM | POA: Diagnosis not present

## 2023-01-24 DIAGNOSIS — E785 Hyperlipidemia, unspecified: Secondary | ICD-10-CM | POA: Diagnosis not present

## 2023-01-24 DIAGNOSIS — I2510 Atherosclerotic heart disease of native coronary artery without angina pectoris: Secondary | ICD-10-CM | POA: Diagnosis not present

## 2023-01-28 DIAGNOSIS — K219 Gastro-esophageal reflux disease without esophagitis: Secondary | ICD-10-CM | POA: Diagnosis not present

## 2023-01-28 DIAGNOSIS — K2270 Barrett's esophagus without dysplasia: Secondary | ICD-10-CM | POA: Diagnosis not present

## 2023-01-28 DIAGNOSIS — K317 Polyp of stomach and duodenum: Secondary | ICD-10-CM | POA: Diagnosis not present

## 2023-01-28 DIAGNOSIS — K449 Diaphragmatic hernia without obstruction or gangrene: Secondary | ICD-10-CM | POA: Diagnosis not present

## 2023-01-28 DIAGNOSIS — K229 Disease of esophagus, unspecified: Secondary | ICD-10-CM | POA: Diagnosis not present

## 2023-01-31 DIAGNOSIS — Z0000 Encounter for general adult medical examination without abnormal findings: Secondary | ICD-10-CM | POA: Diagnosis not present

## 2023-01-31 DIAGNOSIS — Z9181 History of falling: Secondary | ICD-10-CM | POA: Diagnosis not present

## 2023-02-15 DIAGNOSIS — Z23 Encounter for immunization: Secondary | ICD-10-CM | POA: Diagnosis not present

## 2023-02-26 DIAGNOSIS — Z23 Encounter for immunization: Secondary | ICD-10-CM | POA: Diagnosis not present

## 2023-03-04 ENCOUNTER — Other Ambulatory Visit: Payer: Self-pay | Admitting: Cardiology

## 2023-03-04 DIAGNOSIS — Z1231 Encounter for screening mammogram for malignant neoplasm of breast: Secondary | ICD-10-CM | POA: Diagnosis not present

## 2023-05-05 ENCOUNTER — Ambulatory Visit: Payer: Medicare Other | Attending: Cardiology | Admitting: Cardiology

## 2023-05-05 ENCOUNTER — Encounter: Payer: Self-pay | Admitting: Cardiology

## 2023-05-05 VITALS — BP 142/78 | HR 74 | Ht 68.0 in | Wt 170.8 lb

## 2023-05-05 DIAGNOSIS — I10 Essential (primary) hypertension: Secondary | ICD-10-CM | POA: Insufficient documentation

## 2023-05-05 DIAGNOSIS — I2510 Atherosclerotic heart disease of native coronary artery without angina pectoris: Secondary | ICD-10-CM | POA: Diagnosis not present

## 2023-05-05 DIAGNOSIS — E785 Hyperlipidemia, unspecified: Secondary | ICD-10-CM | POA: Insufficient documentation

## 2023-05-05 MED ORDER — EZETIMIBE 10 MG PO TABS: 10.0000 mg | ORAL_TABLET | Freq: Every day | ORAL | 3 refills | Status: AC

## 2023-05-05 NOTE — Addendum Note (Signed)
Addended by: Roxanne Mins I on: 05/05/2023 09:43 AM   Modules accepted: Orders
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
## 2023-05-05 NOTE — Patient Instructions (Signed)
Medication Instructions:  Your physician has recommended you make the following change in your medication:   START: Zetia 10 mg daily  *If you need a refill on your cardiac medications before your next appointment, please call your pharmacy*   Lab Work: Your physician recommends that you return for lab work in:   Labs in 6 weeks: Lipids, LFT  If you have labs (blood work) drawn today and your tests are completely normal, you will receive your results only by: [place] (if you have [place]) OR A paper copy in the mail If you have any lab test that is abnormal or we need to change your treatment, we will call you to review the results.   Testing/Procedures: None   Follow-Up: At [facility], you and your health needs are our priority.  As part of our continuing mission to provide you with exceptional heart care, we have created designated Provider Care Teams.  These Care Teams include your primary Cardiologist (physician) and Advanced Practice Providers (APPs -  Physician Assistants and Nurse Practitioners) who all work together to provide you with the care you need, when you need it.  We recommend signing up for the patient portal called "MyChart".  Sign up information is provided on this After Visit Summary.  MyChart is used to connect with patients for Virtual Visits (Telemedicine).  Patients are able to view lab/test results, encounter notes, upcoming appointments, etc.  Non-urgent messages can be sent to your provider as well.   To learn more about what you can do with MyChart, go to [email or web address].    Your next appointment:   6 month(s)  Provider:   Jenne Campus, MD    Other Instructions None

## 2023-05-20 ENCOUNTER — Other Ambulatory Visit: Payer: Self-pay

## 2023-05-20 MED ORDER — AMLODIPINE BESYLATE 2.5 MG PO TABS: 2.5000 mg | ORAL_TABLET | Freq: Every day | ORAL | 2 refills | Status: DC

## 2023-05-20 MED ORDER — RANOLAZINE ER 500 MG PO TB12: 500.0000 mg | ORAL_TABLET | Freq: Two times a day (BID) | ORAL | 2 refills | Status: DC

## 2023-05-20 MED ORDER — CLOPIDOGREL BISULFATE 75 MG PO TABS: 75.0000 mg | ORAL_TABLET | Freq: Every day | ORAL | 2 refills | Status: DC

## 2023-05-26 ENCOUNTER — Telehealth: Payer: Self-pay

## 2023-05-26 NOTE — Telephone Encounter (Signed)
 Auth Submission: NO AUTH NEEDED Site of care: Site of care: [facility] Payer: MEDICARE A/B & BCBS SUPP Medication & CPT/J Code(s) submitted: Leqvio  (Inclisiran) J1306 Route of submission (phone, fax, portal):  Phone # Fax # Auth type: Buy/Bill Units/visits requested: 284mg  x 2 doses Reference number:  Approval from: 08/18/22 to 06/16/24

## 2023-06-07 ENCOUNTER — Ambulatory Visit: Payer: Medicare Other

## 2023-06-07 VITALS — BP 151/95 | HR 78 | Temp 98.1°F | Resp 18 | Ht 68.0 in | Wt 175.4 lb

## 2023-06-07 DIAGNOSIS — E785 Hyperlipidemia, unspecified: Secondary | ICD-10-CM

## 2023-06-07 DIAGNOSIS — E782 Mixed hyperlipidemia: Secondary | ICD-10-CM

## 2023-06-07 MED ORDER — INCLISIRAN SODIUM 284 MG/1.5ML ~~LOC~~ SOSY
284.0000 mg | PREFILLED_SYRINGE | Freq: Once | SUBCUTANEOUS | Status: AC
Start: 2023-06-07 — End: 2023-06-07
  Administered 2023-06-07: 284 mg via SUBCUTANEOUS
  Filled 2023-06-07: qty 1.5

## 2023-06-07 NOTE — Progress Notes (Signed)
Diagnosis: Hyperlipidemia  Provider:  Chilton Greathouse MD  Procedure: Injection  Leqvio (inclisiran), Dose: 284 mg, Site: subcutaneous, Number of injections: 1  Injection Site(s): Left arm  Post Care: Patient declined observation  Discharge: Condition: Good, Destination: Home . AVS Declined  Performed by:  Wyvonne Lenz, RN

## 2023-06-13 DIAGNOSIS — H35372 Puckering of macula, left eye: Secondary | ICD-10-CM | POA: Diagnosis not present

## 2023-06-22 DIAGNOSIS — E785 Hyperlipidemia, unspecified: Secondary | ICD-10-CM | POA: Diagnosis not present

## 2023-06-22 DIAGNOSIS — I10 Essential (primary) hypertension: Secondary | ICD-10-CM | POA: Diagnosis not present

## 2023-06-22 DIAGNOSIS — I2510 Atherosclerotic heart disease of native coronary artery without angina pectoris: Secondary | ICD-10-CM | POA: Diagnosis not present

## 2023-06-23 LAB — HEPATIC FUNCTION PANEL
ALT: 19 [IU]/L (ref 0–32)
AST: 22 [IU]/L (ref 0–40)
Albumin: 4.5 g/dL (ref 3.8–4.8)
Alkaline Phosphatase: 89 [IU]/L (ref 44–121)
Bilirubin Total: 0.5 mg/dL (ref 0.0–1.2)
Bilirubin, Direct: 0.21 mg/dL (ref 0.00–0.40)
Total Protein: 6.6 g/dL (ref 6.0–8.5)

## 2023-06-23 LAB — LIPID PANEL
Chol/HDL Ratio: 2.2 {ratio} (ref 0.0–4.4)
Cholesterol, Total: 139 mg/dL (ref 100–199)
HDL: 62 mg/dL (ref >39)
LDL Chol Calc (NIH): 54 mg/dL (ref 0–99)
Triglycerides: 132 mg/dL (ref 0–149)
VLDL Cholesterol Cal: 23 mg/dL (ref 5–40)

## 2023-06-24 ENCOUNTER — Telehealth: Payer: Self-pay | Admitting: Emergency Medicine

## 2023-06-24 NOTE — Telephone Encounter (Signed)
-----   Message from Ralene Burger sent at 06/23/2023 10:56 AM EST ----- Cholesterol liver function test looking good, continue present management

## 2023-06-24 NOTE — Telephone Encounter (Signed)
LVM 2/7 

## 2023-07-28 DIAGNOSIS — E559 Vitamin D deficiency, unspecified: Secondary | ICD-10-CM | POA: Diagnosis not present

## 2023-07-28 DIAGNOSIS — M1990 Unspecified osteoarthritis, unspecified site: Secondary | ICD-10-CM | POA: Diagnosis not present

## 2023-07-28 DIAGNOSIS — Z9861 Coronary angioplasty status: Secondary | ICD-10-CM | POA: Diagnosis not present

## 2023-07-28 DIAGNOSIS — E785 Hyperlipidemia, unspecified: Secondary | ICD-10-CM | POA: Diagnosis not present

## 2023-07-28 DIAGNOSIS — I129 Hypertensive chronic kidney disease with stage 1 through stage 4 chronic kidney disease, or unspecified chronic kidney disease: Secondary | ICD-10-CM | POA: Diagnosis not present

## 2023-07-28 DIAGNOSIS — I2510 Atherosclerotic heart disease of native coronary artery without angina pectoris: Secondary | ICD-10-CM | POA: Diagnosis not present

## 2023-08-24 DIAGNOSIS — M19041 Primary osteoarthritis, right hand: Secondary | ICD-10-CM | POA: Diagnosis not present

## 2023-08-31 ENCOUNTER — Encounter (HOSPITAL_BASED_OUTPATIENT_CLINIC_OR_DEPARTMENT_OTHER): Payer: Self-pay

## 2023-08-31 ENCOUNTER — Other Ambulatory Visit (HOSPITAL_BASED_OUTPATIENT_CLINIC_OR_DEPARTMENT_OTHER): Payer: Self-pay

## 2023-08-31 ENCOUNTER — Ambulatory Visit (HOSPITAL_BASED_OUTPATIENT_CLINIC_OR_DEPARTMENT_OTHER)
Admission: EM | Admit: 2023-08-31 | Discharge: 2023-08-31 | Disposition: A | Discharge status: Home / Self Care | Attending: Nurse Practitioner | Admitting: Nurse Practitioner

## 2023-08-31 ENCOUNTER — Encounter: Payer: Self-pay | Admitting: Cardiology

## 2023-08-31 DIAGNOSIS — R8290 Unspecified abnormal findings in urine: Secondary | ICD-10-CM | POA: Insufficient documentation

## 2023-08-31 DIAGNOSIS — R030 Elevated blood-pressure reading, without diagnosis of hypertension: Secondary | ICD-10-CM | POA: Diagnosis not present

## 2023-08-31 DIAGNOSIS — R300 Dysuria: Secondary | ICD-10-CM | POA: Insufficient documentation

## 2023-08-31 LAB — POCT URINALYSIS DIP (MANUAL ENTRY)
Bilirubin, UA: NEGATIVE
Glucose, UA: NEGATIVE mg/dL
Ketones, POC UA: NEGATIVE mg/dL
Nitrite, UA: POSITIVE — AB
Protein Ur, POC: 100 mg/dL — AB
Spec Grav, UA: >=1.03 — AB (ref 1.010–1.025)
Urobilinogen, UA: 0.2 U/dL
pH, UA: 5 (ref 5.0–8.0)

## 2023-08-31 MED ORDER — CEPHALEXIN 500 MG PO CAPS
500.0000 mg | ORAL_CAPSULE | Freq: Two times a day (BID) | ORAL | 0 refills | Status: AC
  Filled 2023-08-31: qty 14, 7d supply, fill #0

## 2023-08-31 NOTE — ED Triage Notes (Signed)
 Woke up this morning with burning during urination with frequency.
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
## 2023-08-31 NOTE — Discharge Instructions (Signed)
 An antibiotic has been sent to the pharmacy to treat your UTI. Please ensure adequate oral hydration.  May use OTC AZO as needed for discomfort.   Recheck your blood pressure at home and follow up with your PCP if it stays > 140/90.

## 2023-09-02 LAB — URINE CULTURE: Culture: >=100000 — AB

## 2023-11-24 ENCOUNTER — Other Ambulatory Visit: Payer: Self-pay

## 2023-11-24 DIAGNOSIS — E785 Hyperlipidemia, unspecified: Secondary | ICD-10-CM | POA: Insufficient documentation

## 2023-11-28 ENCOUNTER — Ambulatory Visit: Attending: Cardiology | Admitting: Cardiology

## 2023-11-28 ENCOUNTER — Encounter: Payer: Self-pay | Admitting: Cardiology

## 2023-11-28 VITALS — BP 136/92 | HR 76 | Ht 67.6 in | Wt 170.6 lb

## 2023-11-28 DIAGNOSIS — I2510 Atherosclerotic heart disease of native coronary artery without angina pectoris: Secondary | ICD-10-CM | POA: Insufficient documentation

## 2023-11-28 DIAGNOSIS — I10 Essential (primary) hypertension: Secondary | ICD-10-CM | POA: Diagnosis present

## 2023-11-28 DIAGNOSIS — E785 Hyperlipidemia, unspecified: Secondary | ICD-10-CM | POA: Diagnosis present

## 2023-11-28 MED ORDER — NITROGLYCERIN 0.4 MG SL SUBL: 0.4000 mg | SUBLINGUAL_TABLET | SUBLINGUAL | 11 refills | Status: AC | PRN
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
## 2023-11-28 NOTE — Patient Instructions (Signed)
 Medication Instructions:  Your physician recommends that you continue on your current medications as directed. Please refer to the Current Medication list given to you today.  *If you need a refill on your cardiac medications before your next appointment, please call your pharmacy*   Lab Work: None ordered If you have labs (blood work) drawn today and your tests are completely normal, you will receive your results only by: MyChart Message (if you have MyChart) OR A paper copy in the mail If you have any lab test that is abnormal or we need to change your treatment, we will call you to review the results.   Testing/Procedures: You are scheduled for a Myocardial Perfusion Imaging Study.  Please arrive 15 minutes prior to your appointment time for registration and insurance purposes.  The test will take approximately 3 to 4 hours to complete; you may bring reading material.  If someone comes with you to your appointment, they will need to remain in the main lobby due to limited space in the testing area.   How to prepare for your Myocardial Perfusion Test: Do not eat or drink 3 hours prior to your test, except you may have water. Do not consume products containing caffeine (regular or decaffeinated) 12 hours prior to your test. (ex: coffee, chocolate, sodas, tea). Do bring a list of your current medications with you.  If not listed below, you may take your medications as normal. Do wear comfortable clothes (no dresses or overalls) and walking shoes, tennis shoes preferred (No heels or open toe shoes are allowed). Do NOT wear cologne, perfume, aftershave, or lotions (deodorant is allowed). If these instructions are not followed, your test will have to be rescheduled.  If you cannot keep your appointment, please provide 24 hours notification to the Nuclear Lab, to avoid a possible $50 charge to your account.  Follow-Up: At [facility], you and your health needs are our priority.   As part of our continuing mission to provide you with exceptional heart care, we have created designated Provider Care Teams.  These Care Teams include your primary Cardiologist (physician) and Advanced Practice Providers (APPs -  Physician Assistants and Nurse Practitioners) who all work together to provide you with the care you need, when you need it.  We recommend signing up for the patient portal called "MyChart".  Sign up information is provided on this After Visit Summary.  MyChart is used to connect with patients for Virtual Visits (Telemedicine).  Patients are able to view lab/test results, encounter notes, upcoming appointments, etc.  Non-urgent messages can be sent to your provider as well.   To learn more about what you can do with MyChart, go to [email or web address].    Your next appointment:   6 month(s)  Provider:   Gypsy Balsam, MD   Other Instructions  Cardiac Nuclear Scan A cardiac nuclear scan is a test that is done to check the flow of blood to your heart. It is done when you are resting and when you are exercising. The test looks for problems such as: Not enough blood reaching a portion of the heart. The heart muscle not working as it should. You may need this test if you have: Heart disease. Lab results that are not normal. Had heart surgery or a balloon procedure to open up blocked arteries (angioplasty) or a small mesh tube (stent). Chest pain. Shortness of breath. Had a heart attack. In this test, a special dye (tracer) is put into your bloodstream. The  tracer will travel to your heart. A camera will then take pictures of your heart to see how the tracer moves through your heart. This test is usually done at a hospital and takes 2-4 hours. Tell a doctor about: Any allergies you have. All medicines you are taking, including vitamins, herbs, eye drops, creams, and over-the-counter medicines. Any bleeding problems you have. Any surgeries you have had. Any  medical conditions you have. Whether you are pregnant or may be pregnant. Any history of asthma or long-term (chronic) lung disease. Any history of heart rhythm disorders or heart valve conditions. What are the risks? Your doctor will talk with you about risks. These may include: Serious chest pain and heart attack. This is only a risk if the stress portion of the test is done. Fast or uneven heartbeats (palpitations). A feeling of warmth in your chest. This feeling usually does not last long. Allergic reaction to the tracer. Shortness of breath or trouble breathing. What happens before the test? Ask your doctor about changing or stopping your normal medicines. Follow instructions from your doctor about what you cannot eat or drink. Remove your jewelry on the day of the test. Ask your doctor if you need to avoid nicotine or caffeine. What happens during the test? An IV tube will be inserted into one of your veins. Your doctor will give you a small amount of tracer through the IV tube. You will wait for 20-40 minutes while the tracer moves through your bloodstream. Your heart will be monitored with an electrocardiogram (ECG). You will lie down on an exam table. Pictures of your heart will be taken for about 15-20 minutes. You may also have a stress test. For this test, one of these things may be done: You will be asked to exercise on a treadmill or a stationary bike. You will be given medicines that will make your heart work harder. This is done if you are unable to exercise. When blood flow to your heart has peaked, a tracer will again be given through the IV tube. After 20-40 minutes, you will get back on the exam table. More pictures will be taken of your heart. Depending on the tracer that is used, more pictures may need to be taken 3-4 hours later. Your IV tube will be removed when the test is over. The test may vary among doctors and hospitals. What happens after the test? Ask  your doctor: Whether you can return to your normal schedule, including diet, activities, travel, and medicines. Whether you should drink more fluids. This will help to remove the tracer from your body. Ask your doctor, or the department that is doing the test: When will my results be ready? How will I get my results? What are my treatment options? What other tests do I need? What are my next steps? This information is not intended to replace advice given to you by your health care provider. Make sure you discuss any questions you have with your health care provider. Document Revised: 09/29/2021 Document Reviewed: 09/29/2021 Elsevier Patient Education  2023 [employer].

## 2023-11-28 NOTE — Addendum Note (Signed)
 Addended by: ONEITA BERLINER on: 11/28/2023 09:26 AM   Modules accepted: Orders

## 2023-11-29 ENCOUNTER — Telehealth: Payer: Self-pay | Admitting: *Deleted

## 2023-11-29 NOTE — Telephone Encounter (Signed)
 LEFT DETAILED INSTRUCTIONS FOR mpi STUDY.

## 2023-11-30 ENCOUNTER — Ambulatory Visit: Attending: Cardiology

## 2023-11-30 DIAGNOSIS — I2510 Atherosclerotic heart disease of native coronary artery without angina pectoris: Secondary | ICD-10-CM | POA: Diagnosis not present

## 2023-11-30 MED ORDER — TECHNETIUM TC 99M TETROFOSMIN IV KIT
31.3000 | PACK | Freq: Once | INTRAVENOUS | Status: AC | PRN
  Administered 2023-11-30: 31.3 via INTRAVENOUS

## 2023-11-30 MED ORDER — TECHNETIUM TC 99M TETROFOSMIN IV KIT
10.8000 | PACK | Freq: Once | INTRAVENOUS | Status: AC | PRN
  Administered 2023-11-30: 10.8 via INTRAVENOUS

## 2023-12-05 NOTE — Addendum Note (Signed)
 Addended by: BERNIE CHARLESTON on: 12/05/2023 10:41 AM   Modules accepted: Orders

## 2023-12-06 ENCOUNTER — Ambulatory Visit (INDEPENDENT_AMBULATORY_CARE_PROVIDER_SITE_OTHER): Payer: Medicare Other

## 2023-12-06 VITALS — BP 127/83 | HR 71 | Temp 98.3°F | Resp 20 | Ht 68.0 in | Wt 169.0 lb

## 2023-12-06 DIAGNOSIS — E782 Mixed hyperlipidemia: Secondary | ICD-10-CM | POA: Diagnosis not present

## 2023-12-06 DIAGNOSIS — E785 Hyperlipidemia, unspecified: Secondary | ICD-10-CM | POA: Diagnosis not present

## 2023-12-06 LAB — MYOCARDIAL PERFUSION IMAGING
Angina Index: 0
Duke Treadmill Score: 6
Estimated workload: 7
Exercise duration (min): 5 min
Exercise duration (sec): 30 s
LV dias vol: 58 mL (ref 46–106)
LV sys vol: 23 mL (ref 3.8–5.2)
MPHR: 145 {beats}/min
Nuc Stress EF: 61 %
Peak HR: 129 {beats}/min
Percent HR: 88 %
RPE: 18
Rest HR: 62 {beats}/min
Rest Nuclear Isotope Dose: 10.8 mCi
SDS: 1
SRS: 5
SSS: 4
ST Depression (mm): 0 mm
Stress Nuclear Isotope Dose: 31.3 mCi
TID: 0.95

## 2023-12-06 MED ORDER — INCLISIRAN SODIUM 284 MG/1.5ML ~~LOC~~ SOSY
284.0000 mg | PREFILLED_SYRINGE | Freq: Once | SUBCUTANEOUS | Status: AC
  Administered 2023-12-06: 284 mg via SUBCUTANEOUS
  Filled 2023-12-06: qty 1.5

## 2023-12-06 NOTE — Progress Notes (Signed)
 Diagnosis: Hyperlipidemia  Provider:  Chilton Greathouse MD  Procedure: Injection  Leqvio (inclisiran), Dose: 284 mg, Site: subcutaneous, Number of injections: 1  Injection Site(s): Right arm  Post Care:  right arm injection  Discharge: Condition: Good, Destination: Home . AVS Declined  Performed by:  Rico Ala, LPN

## 2023-12-08 ENCOUNTER — Ambulatory Visit: Payer: Self-pay | Admitting: Cardiology

## 2023-12-14 ENCOUNTER — Telehealth: Payer: Self-pay

## 2023-12-14 NOTE — Telephone Encounter (Signed)
 Left message on My Chart per Dr. Karry note regarding Echo results. Routed to PCP.

## 2023-12-26 ENCOUNTER — Telehealth: Payer: Self-pay

## 2023-12-26 NOTE — Telephone Encounter (Signed)
Pt viewed results on My Chart per Dr. Krasowski's note. Routed to PCP.  

## 2024-01-23 ENCOUNTER — Other Ambulatory Visit: Payer: Self-pay | Admitting: Cardiology

## 2024-01-31 DIAGNOSIS — I2510 Atherosclerotic heart disease of native coronary artery without angina pectoris: Secondary | ICD-10-CM | POA: Diagnosis not present

## 2024-01-31 DIAGNOSIS — E785 Hyperlipidemia, unspecified: Secondary | ICD-10-CM | POA: Diagnosis not present

## 2024-01-31 DIAGNOSIS — J45909 Unspecified asthma, uncomplicated: Secondary | ICD-10-CM | POA: Diagnosis not present

## 2024-01-31 DIAGNOSIS — I129 Hypertensive chronic kidney disease with stage 1 through stage 4 chronic kidney disease, or unspecified chronic kidney disease: Secondary | ICD-10-CM | POA: Diagnosis not present

## 2024-01-31 DIAGNOSIS — Z9861 Coronary angioplasty status: Secondary | ICD-10-CM | POA: Diagnosis not present

## 2024-01-31 DIAGNOSIS — M1990 Unspecified osteoarthritis, unspecified site: Secondary | ICD-10-CM | POA: Diagnosis not present

## 2024-01-31 DIAGNOSIS — E559 Vitamin D deficiency, unspecified: Secondary | ICD-10-CM | POA: Diagnosis not present

## 2024-01-31 DIAGNOSIS — Z1231 Encounter for screening mammogram for malignant neoplasm of breast: Secondary | ICD-10-CM | POA: Diagnosis not present

## 2024-01-31 DIAGNOSIS — M810 Age-related osteoporosis without current pathological fracture: Secondary | ICD-10-CM | POA: Diagnosis not present

## 2024-01-31 DIAGNOSIS — F419 Anxiety disorder, unspecified: Secondary | ICD-10-CM | POA: Diagnosis not present

## 2024-02-01 DIAGNOSIS — Z0000 Encounter for general adult medical examination without abnormal findings: Secondary | ICD-10-CM | POA: Diagnosis not present

## 2024-02-01 DIAGNOSIS — Z9181 History of falling: Secondary | ICD-10-CM | POA: Diagnosis not present

## 2024-02-09 DIAGNOSIS — K2270 Barrett's esophagus without dysplasia: Secondary | ICD-10-CM | POA: Diagnosis not present

## 2024-02-10 DIAGNOSIS — Z23 Encounter for immunization: Secondary | ICD-10-CM | POA: Diagnosis not present

## 2024-03-14 ENCOUNTER — Inpatient Hospital Stay (INDEPENDENT_AMBULATORY_CARE_PROVIDER_SITE_OTHER)
Admission: RE | Admit: 2024-03-14 | Discharge: 2024-03-14 | Disposition: A | Discharge status: Home / Self Care | Source: Ambulatory Visit | Attending: Internal Medicine | Admitting: Internal Medicine

## 2024-03-14 ENCOUNTER — Encounter (HOSPITAL_BASED_OUTPATIENT_CLINIC_OR_DEPARTMENT_OTHER): Payer: Self-pay | Admitting: Radiology

## 2024-03-14 ENCOUNTER — Other Ambulatory Visit (HOSPITAL_BASED_OUTPATIENT_CLINIC_OR_DEPARTMENT_OTHER): Payer: Self-pay | Admitting: Internal Medicine

## 2024-03-14 DIAGNOSIS — Z1231 Encounter for screening mammogram for malignant neoplasm of breast: Secondary | ICD-10-CM | POA: Diagnosis not present

## 2024-05-22 ENCOUNTER — Telehealth: Payer: Self-pay

## 2024-05-22 NOTE — Telephone Encounter (Signed)
 Auth Submission: NO AUTH NEEDED Site of care: Site of care: [facility] Payer: Medicare A/B with BCBS supplement Medication & CPT/J Code(s) submitted: Leqvio  (Inclisiran) J1306 Diagnosis Code:  Route of submission (phone, fax, portal):  Phone # Fax # Auth type: Buy/Bill PB Units/visits requested: 284mg  x 2 doses Reference number:  Approval from: 05/22/24 to 06/16/25

## 2024-06-04 ENCOUNTER — Ambulatory Visit: Admitting: Cardiology

## 2024-06-08 ENCOUNTER — Ambulatory Visit

## 2024-06-08 VITALS — BP 161/91 | HR 63 | Temp 98.0°F | Resp 18 | Ht 68.0 in | Wt 172.0 lb

## 2024-06-08 DIAGNOSIS — E785 Hyperlipidemia, unspecified: Secondary | ICD-10-CM | POA: Diagnosis not present

## 2024-06-08 DIAGNOSIS — E782 Mixed hyperlipidemia: Secondary | ICD-10-CM

## 2024-06-08 MED ORDER — INCLISIRAN SODIUM 284 MG/1.5ML ~~LOC~~ SOSY
284.0000 mg | PREFILLED_SYRINGE | Freq: Once | SUBCUTANEOUS | Status: AC
  Administered 2024-06-08: 284 mg via SUBCUTANEOUS
  Filled 2024-06-08: qty 1.5

## 2024-06-08 NOTE — Progress Notes (Signed)
 Diagnosis: Hyperlipidemia  Provider:  Chilton Greathouse MD  Procedure: Injection  Leqvio (inclisiran), Dose: 284 mg, Site: subcutaneous, Number of injections: 1  Injection Site(s): Right arm  Post Care: Patient declined observation  Discharge: Condition: Good, Destination: Home . AVS Declined  Performed by:  Loney Hering, LPN

## 2024-06-20 ENCOUNTER — Ambulatory Visit: Admitting: Cardiology

## 2024-06-20 ENCOUNTER — Encounter: Payer: Self-pay | Admitting: Cardiology

## 2024-06-20 VITALS — BP 138/84 | HR 58 | Ht 68.0 in | Wt 169.0 lb

## 2024-06-20 DIAGNOSIS — I2510 Atherosclerotic heart disease of native coronary artery without angina pectoris: Secondary | ICD-10-CM

## 2024-06-20 DIAGNOSIS — E782 Mixed hyperlipidemia: Secondary | ICD-10-CM | POA: Diagnosis not present

## 2024-06-20 DIAGNOSIS — I10 Essential (primary) hypertension: Secondary | ICD-10-CM | POA: Diagnosis not present

## 2024-06-20 NOTE — Patient Instructions (Signed)
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
## 2024-12-07 ENCOUNTER — Ambulatory Visit
